# Patient Record
Sex: Male | Born: 1970
Health system: Southern US, Community
[De-identification: ages and names within clinical notes are randomized; demographics above are authoritative.]

## PROBLEM LIST (undated history)

## (undated) DIAGNOSIS — E669 Obesity, unspecified: Secondary | ICD-10-CM

## (undated) DIAGNOSIS — H409 Unspecified glaucoma: Secondary | ICD-10-CM

## (undated) DIAGNOSIS — T7840XA Allergy, unspecified, initial encounter: Secondary | ICD-10-CM

## (undated) DIAGNOSIS — E119 Type 2 diabetes mellitus without complications: Secondary | ICD-10-CM

## (undated) DIAGNOSIS — E785 Hyperlipidemia, unspecified: Secondary | ICD-10-CM

## (undated) DIAGNOSIS — F909 Attention-deficit hyperactivity disorder, unspecified type: Secondary | ICD-10-CM

## (undated) HISTORY — DX: Obesity, unspecified: E66.9

## (undated) HISTORY — PX: KNEE SURGERY: SHX244

## (undated) HISTORY — DX: Allergy, unspecified, initial encounter: T78.40XA

---

## 1998-04-15 ENCOUNTER — Emergency Department (HOSPITAL_COMMUNITY): Admission: EM | Admit: 1998-04-15 | Discharge: 1998-04-15 | Payer: Self-pay | Admitting: Emergency Medicine

## 1999-09-11 ENCOUNTER — Emergency Department (HOSPITAL_COMMUNITY): Admission: EM | Admit: 1999-09-11 | Discharge: 1999-09-11 | Payer: Self-pay | Admitting: Emergency Medicine

## 1999-09-11 ENCOUNTER — Encounter: Payer: Self-pay | Admitting: Emergency Medicine

## 1999-09-22 ENCOUNTER — Encounter: Payer: Self-pay | Admitting: Emergency Medicine

## 1999-09-22 ENCOUNTER — Emergency Department (HOSPITAL_COMMUNITY): Admission: EM | Admit: 1999-09-22 | Discharge: 1999-09-22 | Payer: Self-pay | Admitting: Emergency Medicine

## 2008-09-22 ENCOUNTER — Emergency Department (HOSPITAL_COMMUNITY): Admission: EM | Admit: 2008-09-22 | Discharge: 2008-09-23 | Payer: Self-pay | Admitting: Emergency Medicine

## 2011-08-19 LAB — URINALYSIS, ROUTINE W REFLEX MICROSCOPIC
Bilirubin Urine: NEGATIVE
Glucose, UA: NEGATIVE
Hgb urine dipstick: NEGATIVE
Ketones, ur: NEGATIVE
Nitrite: NEGATIVE
Protein, ur: NEGATIVE
Specific Gravity, Urine: 1.021
Urobilinogen, UA: 0.2
pH: 6

## 2011-08-19 LAB — POCT I-STAT, CHEM 8
BUN: 19
Chloride: 101
Creatinine, Ser: 1.3
Potassium: 3.7
Sodium: 135

## 2011-08-19 LAB — CBC
HCT: 40.5
Hemoglobin: 13.7
MCHC: 33.9
MCV: 90.4
Platelets: 198
RBC: 4.49
RDW: 12.5
WBC: 7.1

## 2011-08-19 LAB — DIFFERENTIAL
Lymphocytes Relative: 16
Lymphs Abs: 1.1
Neutrophils Relative %: 77

## 2011-08-19 LAB — URINE CULTURE
Colony Count: NO GROWTH
Culture: NO GROWTH

## 2011-11-02 ENCOUNTER — Ambulatory Visit (INDEPENDENT_AMBULATORY_CARE_PROVIDER_SITE_OTHER): Payer: BC Managed Care – PPO

## 2011-11-02 DIAGNOSIS — F988 Other specified behavioral and emotional disorders with onset usually occurring in childhood and adolescence: Secondary | ICD-10-CM

## 2011-11-02 DIAGNOSIS — R21 Rash and other nonspecific skin eruption: Secondary | ICD-10-CM

## 2011-12-25 ENCOUNTER — Telehealth: Payer: Self-pay

## 2011-12-25 NOTE — Telephone Encounter (Signed)
PT SAYS THAT THE 10 MG ADDERALL WAS NOT SIGNED AND WOULD LIKE FOR HIS GIRLFRIEND WOULD LIKE TO BRING THE SCRIPT BACK SO WE CAN WRITE ANOTHER ONE WITH A SIGNATURE WOULD LIKE A NURSE TO CALL HIM ABOUT THIS

## 2011-12-25 NOTE — Telephone Encounter (Signed)
SPOKE WITH PT AND APOLOGIZED FOR UNSIGNED RX. PT WILL HAVE HIS GIRLFRIEND COME TO GET IT SIGNED TODAY

## 2012-01-01 ENCOUNTER — Telehealth: Payer: Self-pay

## 2012-01-01 MED ORDER — AMPHETAMINE-DEXTROAMPHET ER 10 MG PO CP24: ORAL_CAPSULE | ORAL | Status: DC

## 2012-01-01 MED ORDER — AMPHETAMINE-DEXTROAMPHET ER 20 MG PO CP24: ORAL_CAPSULE | ORAL | Status: DC

## 2012-01-01 NOTE — Telephone Encounter (Signed)
.  [facility] PT IN NEED OF HIS ADDERALL. PLEASE CALL [phone] WHEN READY FOR PICK UP

## 2012-01-01 NOTE — Telephone Encounter (Signed)
Done. Ready to pick up.  Craig Jordan

## 2012-01-01 NOTE — Telephone Encounter (Signed)
LMOM THAT RX IS READY FOR PICKUP 

## 2012-01-01 NOTE — Telephone Encounter (Signed)
Pt last seen for this 11/02/11. Last RF 12/03/11.

## 2012-01-12 NOTE — Telephone Encounter (Signed)
Please contact pt he is upset that insurance comp want fill his rx, because of some timing issues on getting his rx refilled please contact.

## 2012-02-02 ENCOUNTER — Telehealth: Payer: Self-pay

## 2012-02-02 MED ORDER — AMPHETAMINE-DEXTROAMPHET ER 20 MG PO CP24: ORAL_CAPSULE | ORAL | Status: DC

## 2012-02-02 MED ORDER — AMPHETAMINE-DEXTROAMPHET ER 10 MG PO CP24: ORAL_CAPSULE | ORAL | Status: DC

## 2012-02-02 NOTE — Telephone Encounter (Signed)
Pt last seen for this 11/02/11, last RFs 01/01/12.

## 2012-02-02 NOTE — Telephone Encounter (Signed)
Signed at TL desk - ok to fill until 6/13

## 2012-02-02 NOTE — Telephone Encounter (Signed)
.  [facility]     PT REQUESTING ADDERALL REFILL   BEST PHONE [phone]

## 2012-02-03 NOTE — Telephone Encounter (Signed)
Pt notified that rx is ready for pick up

## 2012-02-24 ENCOUNTER — Telehealth: Payer: Self-pay

## 2012-02-24 MED ORDER — AMPHETAMINE-DEXTROAMPHET ER 10 MG PO CP24: ORAL_CAPSULE | ORAL | Status: DC

## 2012-02-24 NOTE — Telephone Encounter (Signed)
Spoke to pt, he has already been contacted.  Reviewed new dosage directions with him, he is aware of change and that rx will be at the front.

## 2012-02-24 NOTE — Telephone Encounter (Signed)
Pt called asking if he could possibly get his Adderall XR Rxs changed to all in 20 mg TABLET form. He currently gets XR 20 mg capsules and takes two Qam and then he gets XR 10 mg capsules and takes 1 -2 capsules Q 3pm. He would like to only have to p/up one Rx a month and only have one co-pay. Can we change it to tablets if it comes this way so he can get XR 20 mg tablets and take two Qam and 1/2 - 1 tab Q 3 pm? He is not due for another RF until 4/17 and he says that is fine, he has some left, he just wanted to get this changed if possible.

## 2012-02-24 NOTE — Telephone Encounter (Signed)
Adderall XR does not come in tablets but I can decreased the dose and have the pt take more pills in the am.  I have written the RX - I know it is not time but I wrote the RX to have it filled at the correct time.  So please go over this RX with the patient so he understands the change - He takes Adderall XR 40mg  q am and 10-20 in the afternoon.  I wrote them all in Adderall XR 10mg  to make the correct dose.

## 2012-03-06 ENCOUNTER — Other Ambulatory Visit: Payer: Self-pay

## 2012-03-30 ENCOUNTER — Telehealth: Payer: Self-pay

## 2012-03-30 NOTE — Telephone Encounter (Signed)
PT IN NEED OF HIS ADDERALL, STATES SINCE HE TAKES 4 IN THE MORNING AND 4 IN THE AFTERNOON WITH 10MG S. WOULD LIKE TO KNOW IF WE COULD UP HIS MGS SO HE WON'T HAVE TO TAKE SO  MANY PLEASE CALL [phone]

## 2012-04-02 NOTE — Telephone Encounter (Signed)
Please pull paper chart.  

## 2012-04-05 ENCOUNTER — Telehealth: Payer: Self-pay

## 2012-04-05 MED ORDER — AMPHETAMINE-DEXTROAMPHET ER 20 MG PO CP24: 40.0000 mg | ORAL_CAPSULE | Freq: Two times a day (BID) | ORAL | Status: DC

## 2012-04-05 NOTE — Telephone Encounter (Signed)
Notified pt that Rx is ready for p/up and that he is due for f/up bf next RF. Pt agreed.

## 2012-04-05 NOTE — Telephone Encounter (Signed)
PT STATES HE REALLY NEED HIS ADDERALL BY TODAY. PLEASE CALL [phone]

## 2012-04-05 NOTE — Telephone Encounter (Signed)
rx printed. Please advise patient that he needs OV for additional fills.

## 2012-04-05 NOTE — Telephone Encounter (Signed)
Patient's rx was printed, and he has been notified. (see other message)

## 2012-04-05 NOTE — Telephone Encounter (Signed)
Chart is pulled and put in phone messages

## 2012-04-06 ENCOUNTER — Other Ambulatory Visit: Payer: Self-pay | Admitting: Physician Assistant

## 2012-04-06 MED ORDER — AMPHETAMINE-DEXTROAMPHET ER 20 MG PO CP24: ORAL_CAPSULE | ORAL | Status: DC

## 2012-04-06 NOTE — Progress Notes (Signed)
Patient comes in to pick up his rx today and notices that it should be written as Adderall XR 20 mg 2 po QAM, then 1 po QPM #90. It was written on 5/20 for Adderall XR 20 mg 2 po QAM, then 2 po QPM #60. We have the original Rx and will shred and issue new rx with correct sig and quantity.

## 2012-05-17 ENCOUNTER — Ambulatory Visit (INDEPENDENT_AMBULATORY_CARE_PROVIDER_SITE_OTHER): Payer: BC Managed Care – PPO | Admitting: Family Medicine

## 2012-05-17 VITALS — BP 130/86 | HR 67 | Temp 99.2°F | Resp 16 | Ht 70.5 in | Wt 313.0 lb

## 2012-05-17 DIAGNOSIS — F988 Other specified behavioral and emotional disorders with onset usually occurring in childhood and adolescence: Secondary | ICD-10-CM

## 2012-05-17 MED ORDER — AMPHETAMINE-DEXTROAMPHETAMINE 20 MG PO TABS: ORAL_TABLET | ORAL | Status: DC

## 2012-05-17 NOTE — Progress Notes (Signed)
  S:  Initally diagnosed by Natividad Brood in childhood with ADD Doing well now. 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  He will try to taper pm dose now.  Plan:  Adderall (plain) 20 mg ii am and 1/2 to 1 in pm. Ok to refill x 6 months.

## 2012-06-12 ENCOUNTER — Telehealth: Payer: Self-pay

## 2012-06-12 DIAGNOSIS — F988 Other specified behavioral and emotional disorders with onset usually occurring in childhood and adolescence: Secondary | ICD-10-CM

## 2012-06-12 MED ORDER — AMPHETAMINE-DEXTROAMPHETAMINE 20 MG PO TABS: ORAL_TABLET | ORAL | Status: DC

## 2012-06-12 NOTE — Telephone Encounter (Signed)
LMOM THAT RX IS READY FOR PICKUP 

## 2012-06-12 NOTE — Telephone Encounter (Signed)
Pt is needing to get his adderall refilled please call [phone] when ready to pick up

## 2012-06-12 NOTE — Telephone Encounter (Signed)
Rx refilled and ready for pickup. May fill 06/17/12

## 2012-07-06 ENCOUNTER — Emergency Department (HOSPITAL_COMMUNITY)
Admission: EM | Admit: 2012-07-06 | Discharge: 2012-07-07 | Disposition: A | Discharge status: Home / Self Care | Payer: BC Managed Care – PPO | Attending: Emergency Medicine | Admitting: Emergency Medicine

## 2012-07-06 ENCOUNTER — Emergency Department (HOSPITAL_COMMUNITY): Payer: BC Managed Care – PPO

## 2012-07-06 ENCOUNTER — Encounter (HOSPITAL_COMMUNITY): Payer: Self-pay | Admitting: *Deleted

## 2012-07-06 DIAGNOSIS — F909 Attention-deficit hyperactivity disorder, unspecified type: Secondary | ICD-10-CM | POA: Insufficient documentation

## 2012-07-06 DIAGNOSIS — IMO0002 Reserved for concepts with insufficient information to code with codable children: Secondary | ICD-10-CM | POA: Insufficient documentation

## 2012-07-06 DIAGNOSIS — T148XXA Other injury of unspecified body region, initial encounter: Secondary | ICD-10-CM

## 2012-07-06 DIAGNOSIS — X500XXA Overexertion from strenuous movement or load, initial encounter: Secondary | ICD-10-CM | POA: Insufficient documentation

## 2012-07-06 HISTORY — DX: Attention-deficit hyperactivity disorder, unspecified type: F90.9

## 2012-07-06 MED ORDER — CYCLOBENZAPRINE HCL 10 MG PO TABS
10.0000 mg | ORAL_TABLET | Freq: Once | ORAL | Status: AC
  Administered 2012-07-07: 10 mg via ORAL
  Filled 2012-07-06: qty 1

## 2012-07-06 NOTE — ED Notes (Signed)
Pt states family member ran and jumped on his back and he felt a popping sensation in right rib area; concerned may have cracked a rib

## 2012-07-07 MED ORDER — CYCLOBENZAPRINE HCL 10 MG PO TABS: 10.0000 mg | ORAL_TABLET | Freq: Two times a day (BID) | ORAL | Status: AC | PRN

## 2012-07-07 NOTE — ED Provider Notes (Signed)
History     CSN: [id number]  Arrival date & time 07/06/12  2153   First MD Initiated Contact with Patient 07/06/12 2354      Chief Complaint  Patient presents with  . Flank Pain    (Consider location/radiation/quality/duration/timing/severity/associated sxs/prior treatment) HPI Comments: Craig Jordan 41 y.o. male   The chief complaint is: Patient presents with:   Flank Pain   The patient has medical history significant for:   Past Medical History:   ADHD (attention deficit hyperactivity disorder)             Patient presents for right rib pain. He states that his wife jumped on his back and he felt a "popping sensation". Patient is concerned for fracture. Patient states that pain is worse with turning and rates it a 7/10. Patient took one of his wife's flexeril with relief for a few hours. Denies constitutional symptoms. Denies CP, palpitations, or SOB.      The history is provided by the patient.    Past Medical History  Diagnosis Date  . ADHD (attention deficit hyperactivity disorder)     History reviewed. No pertinent past surgical history.  No family history on file.  History  Substance Use Topics  . Smoking status: Never Smoker   . Smokeless tobacco: Not on file  . Alcohol Use: Yes     social      Review of Systems  Constitutional: Negative for fever, chills and diaphoresis.  Respiratory: Negative for shortness of breath.   Cardiovascular: Negative for chest pain.  Musculoskeletal: Positive for back pain.  All other systems reviewed and are negative.    Allergies  Review of patient's allergies indicates no known allergies.  Home Medications   Current Outpatient Rx  Name Route Sig Dispense Refill  . AMPHETAMINE-DEXTROAMPHETAMINE 20 MG PO TABS Oral Take 20-40 mg by mouth 2 (two) times daily. 2 in am, 1 in pm    . TRAVOPROST (BAK FREE) 0.004 % OP SOLN Both Eyes Place 1 drop into both eyes at bedtime.      BP 111/62  Pulse 55  Temp 98.6  F (37 C) (Oral)  Resp 26  Ht 5\' 11"  (1.803 m)  Wt 315 lb (142.883 kg)  BMI 43.93 kg/m2  SpO2 100%  Physical Exam  Nursing note and vitals reviewed. Constitutional: He appears well-developed and well-nourished. No distress.  HENT:  Head: Normocephalic and atraumatic.  Mouth/Throat: Oropharynx is clear and moist.  Eyes: Conjunctivae and EOM are normal. No scleral icterus.  Neck: Normal range of motion. Neck supple.  Cardiovascular: Regular rhythm and normal heart sounds.   Pulmonary/Chest: Effort normal and breath sounds normal.  Abdominal: Soft. Bowel sounds are normal. There is no tenderness.  Musculoskeletal: Normal range of motion. He exhibits tenderness.       Tenderness to palpation of right thoracic area.  Neurological: He is alert.  Skin: Skin is warm and dry.    ED Course  Procedures (including critical care time)  Labs Reviewed - No data to display Dg Ribs Unilateral W/chest Right  07/06/2012  *RADIOLOGY REPORT*  Clinical Data: Flank pain.  The patient felt a pop in the mid posterior right rib area after injury.  RIGHT RIBS AND CHEST - 3+ VIEW  Comparison: None.  Findings: Slightly shallow inspiration with elevation of the left hemidiaphragm.  Normal heart size and pulmonary vascularity. Scattered fibrosis in the lungs.  No focal airspace consolidation. No edema.  No blunting of costophrenic angles.  No pneumothorax.  Mediastinal contours appear intact.  Right ribs appear intact.  No displaced fractures or focal bone lesions appreciated.  Mild degenerative changes in the spine.  IMPRESSION: No evidence of active pulmonary disease.  No displaced right rib fractures.   Original Report Authenticated By: Marlon Pel, M.D.      1. Muscle strain       MDM  Patient presented for right rib pain after his wife jumped on his back. Patient given flexeril in ED with relief. Patient discharged on the same. CXR: negative for rib fracture or other process. No red flags for  fracture or pneumothorax. Return precautions given verbally and in discharge summary.         Pixie Casino, PA-C 07/07/12 [phone]

## 2012-07-08 NOTE — ED Provider Notes (Signed)
Medical screening examination/treatment/procedure(s) were performed by non-physician practitioner and as supervising physician I was immediately available for consultation/collaboration.  Taniya Dasher R Treyvon Blahut, MD 07/08/12 0817 

## 2012-07-14 ENCOUNTER — Telehealth: Payer: Self-pay

## 2012-07-14 NOTE — Telephone Encounter (Signed)
Pt called to get his adderall rx called in to his pharmacy. I informed him that he may be advised to come in for an OV. He can be reached at [phone].

## 2012-07-15 MED ORDER — AMPHETAMINE-DEXTROAMPHETAMINE 20 MG PO TABS: 20.0000 mg | ORAL_TABLET | Freq: Two times a day (BID) | ORAL | Status: DC

## 2012-07-15 NOTE — Telephone Encounter (Signed)
Rx done and ready for pickup 

## 2012-07-15 NOTE — Telephone Encounter (Signed)
Should be ok for refills x 4 more months

## 2012-07-16 NOTE — Telephone Encounter (Signed)
LMOM RX ready to pick up. 

## 2012-08-21 ENCOUNTER — Telehealth: Payer: Self-pay

## 2012-08-21 MED ORDER — AMPHETAMINE-DEXTROAMPHETAMINE 20 MG PO TABS: 20.0000 mg | ORAL_TABLET | Freq: Two times a day (BID) | ORAL | Status: DC

## 2012-08-21 NOTE — Telephone Encounter (Signed)
LMOM RX ready to pick up. 

## 2012-08-21 NOTE — Telephone Encounter (Signed)
PT WOULD LIKE TO HAVE A REFILL ON HIS ADDERALL RX.  BEST# [phone]

## 2012-08-21 NOTE — Telephone Encounter (Signed)
Rx ready for pick up. 

## 2012-09-29 ENCOUNTER — Telehealth: Payer: Self-pay

## 2012-09-29 MED ORDER — AMPHETAMINE-DEXTROAMPHETAMINE 20 MG PO TABS: ORAL_TABLET | ORAL | Status: DC

## 2012-09-29 NOTE — Telephone Encounter (Signed)
Rx at front desk, called patient did not leave mssg b/c this is his business line.

## 2012-09-29 NOTE — Telephone Encounter (Signed)
Medication refilled, printed, signed, and at the TL desk.  

## 2012-09-29 NOTE — Telephone Encounter (Signed)
Patient advised.

## 2012-09-29 NOTE — Telephone Encounter (Signed)
Patient due for follow up in January/ Rx pended

## 2012-09-29 NOTE — Telephone Encounter (Signed)
Pt would like a refill on adderall. °Best# [phone] °

## 2012-10-30 ENCOUNTER — Telehealth: Payer: Self-pay

## 2012-10-30 MED ORDER — AMPHETAMINE-DEXTROAMPHETAMINE 20 MG PO TABS: ORAL_TABLET | ORAL | Status: DC

## 2012-10-30 NOTE — Telephone Encounter (Signed)
Rx printed.  Needs OV for next fill.

## 2012-10-30 NOTE — Telephone Encounter (Signed)
Pt would like a refill on adderall. Best# [phone]

## 2012-11-01 NOTE — Telephone Encounter (Signed)
Called patient to advise. He is reminded he is due for follow up in Jan.

## 2012-12-01 ENCOUNTER — Telehealth: Payer: Self-pay

## 2012-12-01 NOTE — Telephone Encounter (Signed)
Pt is needing to talk with someone about possibly scheduling a referral to a cardiologist   Best number [phone]

## 2012-12-02 ENCOUNTER — Emergency Department (HOSPITAL_COMMUNITY)
Admission: EM | Admit: 2012-12-02 | Discharge: 2012-12-03 | Disposition: A | Discharge status: Home / Self Care | Payer: BC Managed Care – PPO | Attending: Emergency Medicine | Admitting: Emergency Medicine

## 2012-12-02 DIAGNOSIS — Z79899 Other long term (current) drug therapy: Secondary | ICD-10-CM | POA: Insufficient documentation

## 2012-12-02 DIAGNOSIS — E669 Obesity, unspecified: Secondary | ICD-10-CM | POA: Insufficient documentation

## 2012-12-02 DIAGNOSIS — Z87891 Personal history of nicotine dependence: Secondary | ICD-10-CM | POA: Insufficient documentation

## 2012-12-02 DIAGNOSIS — H409 Unspecified glaucoma: Secondary | ICD-10-CM | POA: Insufficient documentation

## 2012-12-02 DIAGNOSIS — F909 Attention-deficit hyperactivity disorder, unspecified type: Secondary | ICD-10-CM | POA: Insufficient documentation

## 2012-12-02 DIAGNOSIS — R0789 Other chest pain: Secondary | ICD-10-CM | POA: Insufficient documentation

## 2012-12-02 DIAGNOSIS — R079 Chest pain, unspecified: Secondary | ICD-10-CM

## 2012-12-02 HISTORY — DX: Unspecified glaucoma: H40.9

## 2012-12-02 NOTE — Telephone Encounter (Signed)
Left message for patient to return call.

## 2012-12-02 NOTE — Telephone Encounter (Signed)
Patient due now for a follow up with his Adderall. Why does he need Cardiology referral?

## 2012-12-02 NOTE — Telephone Encounter (Signed)
Pt CB and reports that he has been experiencing some strange feelings in his chest mainly at night/evenings and not r/t exertion. He states it is not a pain but he feels like he should have his heart checked. I advised him that he should come in to be evaluated and possibly have an EKG and provider can refer to cardiologist if appropriate. Pt stated that is fine because he needs recheck for Adderall anyway. He will plan to come see her first thing Mon morning. I advised him if he has any pain/pressure in his chest/arm/jaw or SOB to go to ER. Pt agreed.

## 2012-12-03 ENCOUNTER — Encounter (HOSPITAL_COMMUNITY): Payer: Self-pay | Admitting: *Deleted

## 2012-12-03 ENCOUNTER — Emergency Department (HOSPITAL_COMMUNITY): Payer: BC Managed Care – PPO

## 2012-12-03 LAB — CBC WITH DIFFERENTIAL/PLATELET
Eosinophils Absolute: 0.2 10*3/uL (ref 0.0–0.7)
Eosinophils Relative: 3 % (ref 0–5)
HCT: 41.9 % (ref 39.0–52.0)
Hemoglobin: 14.6 g/dL (ref 13.0–17.0)
Lymphs Abs: 2.8 10*3/uL (ref 0.7–4.0)
MCH: 30.5 pg (ref 26.0–34.0)
MCV: 87.5 fL (ref 78.0–100.0)
Monocytes Relative: 6 % (ref 3–12)
Neutrophils Relative %: 47 % (ref 43–77)
RBC: 4.79 MIL/uL (ref 4.22–5.81)

## 2012-12-03 LAB — POCT I-STAT TROPONIN I: Troponin i, poc: 0 ng/mL (ref 0.00–0.08)

## 2012-12-03 LAB — BASIC METABOLIC PANEL
BUN: 21 mg/dL (ref 6–23)
GFR calc non Af Amer: >90 mL/min (ref >90)
Glucose, Bld: 86 mg/dL (ref 70–99)
Potassium: 3.8 mEq/L (ref 3.5–5.1)

## 2012-12-03 MED ORDER — DIAZEPAM 5 MG PO TABS
5.0000 mg | ORAL_TABLET | Freq: Once | ORAL | Status: AC
  Administered 2012-12-03: 5 mg via ORAL
  Filled 2012-12-03: qty 1

## 2012-12-03 MED ORDER — IBUPROFEN 800 MG PO TABS
800.0000 mg | ORAL_TABLET | Freq: Once | ORAL | Status: AC
  Administered 2012-12-03: 800 mg via ORAL
  Filled 2012-12-03: qty 1

## 2012-12-03 MED ORDER — OXYCODONE-ACETAMINOPHEN 5-325 MG PO TABS
1.0000 | ORAL_TABLET | Freq: Once | ORAL | Status: AC
  Administered 2012-12-03: 1 via ORAL
  Filled 2012-12-03: qty 1

## 2012-12-03 MED ORDER — IBUPROFEN 800 MG PO TABS: 800.0000 mg | ORAL_TABLET | Freq: Three times a day (TID) | ORAL | Status: DC

## 2012-12-03 NOTE — ED Provider Notes (Signed)
History     CSN: [id number]  Arrival date & time 12/02/12  2346   First MD Initiated Contact with Patient 12/02/12 2357      Chief Complaint  Patient presents with  . Chest Pain    (Consider location/radiation/quality/duration/timing/severity/associated sxs/prior treatment) HPI Comments: Craig Jordan reports having left-sided chest and axillary pain over the last 6 weeks.  Craig Jordan reports it is most noticeable at night when Craig Jordan lays down to sleep.  Craig Jordan describes an intermittent dull aching discomfort.  Sometimes Craig Jordan will have very transient sharp pain also.  The pain will often be relieved by raising his left arm above his head.  Craig Jordan denies fever, recent travel, leg pain/swelling, palpitations, cough, and SOB.  Craig Jordan also denies reflux/GERD/indigestion.    Patient is a 42 y.o. male presenting with chest pain. The history is provided by the patient. No language interpreter was used.  Chest Pain The chest pain began more  than 1 month ago (6 weeks). Chest pain occurs intermittently. The chest pain is worsening. At its most intense, the pain is at 7/10. The pain is currently at 6/10. The severity of the pain is moderate. The quality of the pain is described as sharp and stabbing (Craig Jordan describes a dull aching and a sharp pain.). The pain does not radiate. Chest pain is worsened by certain positions. Pertinent negatives for primary symptoms include no fever, no fatigue, no syncope, no shortness of breath, no cough, no wheezing, no palpitations, no abdominal pain, no nausea, no vomiting, no dizziness and no altered mental status.  Pertinent negatives for associated symptoms include no claudication, no diaphoresis, no lower extremity edema, no near-syncope, no orthopnea and no weakness. Craig Jordan tried nothing for the symptoms. Risk factors include male gender, obesity and stress (marijuana use).     Past Medical History  Diagnosis Date  . ADHD (attention deficit hyperactivity disorder)   . Glaucoma     Past  Surgical History  Procedure Date  . Knee surgery     Family History  Problem Relation Age of Onset  . Seizures Father     History  Substance Use Topics  . Smoking status: Former Smoker    Quit date: 05/17/1994  . Smokeless tobacco: Not on file  . Alcohol Use: 12.0 oz/week    20 Cans of beer per week      Review of Systems  Constitutional: Negative for fever, diaphoresis and fatigue.  Respiratory: Negative for cough, shortness of breath and wheezing.   Cardiovascular: Positive for chest pain. Negative for palpitations, orthopnea, claudication, syncope and near-syncope.  Gastrointestinal: Negative for nausea, vomiting and abdominal pain.  Neurological: Negative for dizziness and weakness.  Psychiatric/Behavioral: Negative for altered mental status.  All other systems reviewed and are negative.    Allergies  Review of patient's allergies indicates no known allergies.  Home Medications   Current Outpatient Rx  Name  Route  Sig  Dispense  Refill  . AMPHETAMINE-DEXTROAMPHETAMINE 20 MG PO TABS      Take 2 po in the morning and 1 po in the afternoon.   90 tablet   0     Patient due for follow up in January   . OXYMETAZOLINE HCL 0.05 % NA SOLN   Nasal   Place 2 sprays into the nose at bedtime as needed. For nasal congestion         . TRAVOPROST (BAK FREE) 0.004 % OP SOLN   Both Eyes   Place 1 drop into both eyes  at bedtime.           There were no vitals taken for this visit.  Physical Exam  Nursing note and vitals reviewed. Constitutional: Craig Jordan is oriented to person, place, and time. Craig Jordan appears well-nourished. No distress.       Pt is morbidly obese   HENT:  Head: Normocephalic and atraumatic.  Right Ear: External ear normal.  Left Ear: External ear normal.  Nose: Nose normal.  Mouth/Throat: Oropharynx is clear and moist. No oropharyngeal exudate.  Eyes: Conjunctivae normal are normal. Pupils are equal, round, and reactive to light. Right eye exhibits  no discharge. Left eye exhibits no discharge. No scleral icterus.  Neck: Normal range of motion. Neck supple. No JVD present. No tracheal deviation present.  Cardiovascular: Normal rate, regular rhythm, normal heart sounds and intact distal pulses.  Exam reveals no gallop and no friction rub.   No murmur heard. Pulmonary/Chest: Effort normal and breath sounds normal. No stridor. No respiratory distress. Craig Jordan has no wheezes. Craig Jordan has no rales. Craig Jordan exhibits no tenderness.  Abdominal: Soft. Bowel sounds are normal. Craig Jordan exhibits no distension and no mass. There is no tenderness. There is no rebound and no guarding.  Musculoskeletal: Normal range of motion. Craig Jordan exhibits no edema and no tenderness.  Lymphadenopathy:    Craig Jordan has no cervical adenopathy.  Neurological: Craig Jordan is alert and oriented to person, place, and time. No cranial nerve deficit.  Skin: Skin is warm and dry. No rash noted. Craig Jordan is not diaphoretic. No erythema. No pallor.  Psychiatric: Craig Jordan has a normal mood and affect. His behavior is normal.    ED Course  Procedures (including critical care time)  Labs Reviewed - No data to display No results found.   No diagnosis found.   Date: 12/03/2012 @ 0007  Rate: 72 bpm  Rhythm: sinus  QRS Axis: left  Intervals: normal  ST/T Wave abnormalities: normal  Conduction Disutrbances:nonspecific intraventricular conduction delay  Narrative Interpretation:   Old EKG Reviewed: none available      MDM  Pt presents for evaluation of chest discomfort.  Craig Jordan appears comfortable, note stable VS, NAD.  Although Craig Jordan describes positional pain, I cannot reproduce it on exam.  The EKG performed during the triage process demonstrates no evidence of acute ischemia.  Other than obesity, Craig Jordan denies risk factors for early CAD.  Will obtain basic labs, CXR, and trop.  Will provide symptomatic care, and reassess.  Clinically, Craig Jordan demonstrates no evidence of PE and is at low risk based on Well's Criteria.  1610.  Pt  stable, NAD.  Pain free.  Repeat trop is negative.  Plan discharge home.  Craig Jordan describes symptoms that do not appear to be consistent with chest pain from cardiac ischemia.  Will refer him for non emergent evaluation by a local cardiologist.     Tobin Chad, MD 12/03/12 [phone]

## 2012-12-03 NOTE — ED Notes (Addendum)
EKG old and new given to EDP, Powers, MD.

## 2012-12-03 NOTE — ED Notes (Signed)
Pt reports chest pain off and on x 6 weeks; states has increased in frequency over last week; episodes usually happen at night but lately have begun to happen during the day; pt states episodes increase when pt has not had sleep; episodes of chest pain typically last for a few minutes and then resolve; pt states pain is somewhat relieved by placing left arm over his head; pt denies nausea, SHOB, or vomiting with pain does c/o diaphoresis at times with pain and pain radiates to left shoulder and sometimes to his back.

## 2012-12-03 NOTE — ED Notes (Signed)
Attempted IV x 2 without success; pt becomes very anxious and tense when attempting to stick.

## 2012-12-13 ENCOUNTER — Ambulatory Visit (INDEPENDENT_AMBULATORY_CARE_PROVIDER_SITE_OTHER): Payer: BC Managed Care – PPO | Admitting: Family Medicine

## 2012-12-13 VITALS — BP 128/91 | HR 86 | Temp 98.8°F | Resp 17 | Ht 71.5 in | Wt 323.0 lb

## 2012-12-13 DIAGNOSIS — E669 Obesity, unspecified: Secondary | ICD-10-CM | POA: Insufficient documentation

## 2012-12-13 DIAGNOSIS — F988 Other specified behavioral and emotional disorders with onset usually occurring in childhood and adolescence: Secondary | ICD-10-CM | POA: Insufficient documentation

## 2012-12-13 DIAGNOSIS — Z23 Encounter for immunization: Secondary | ICD-10-CM

## 2012-12-13 MED ORDER — AMPHETAMINE-DEXTROAMPHETAMINE 20 MG PO TABS: ORAL_TABLET | ORAL | Status: DC

## 2012-12-13 NOTE — Progress Notes (Signed)
[facility] [address], [place] [address] [phone]  Date:  12/13/2012   Name:  Craig Jordan.   DOB:  1971-09-25   MRN:  [id number]  PCP:  Provider Not In System    Chief Complaint: Medication Refill   History of Present Illness:  Craig Jordan. is a 42 y.o. very pleasant male patient who presents with the following:  He is here today for a medication refill.  He was at the ED about 10 days ago with CP- he was treated with ibuprofen and had a negative evaluation- he was diagnosed with non- cardiac CP.  He will see cardiology in a few weeks.  He was able to work out yesterday on the eliptical machine without any CP.  The CP has not been bothering him any more.   He works in Aeronautical engineer.  He feels that his adderall is doing a good job controlling his symptoms without any negative SE that he has noted He takes adderall 20, 2 in the am and 1 in the afternoon- 60 mg total daily.   There is no problem list on file for this patient.   Past Medical History  Diagnosis Date  . ADHD (attention deficit hyperactivity disorder)   . Glaucoma     Past Surgical History  Procedure Date  . Knee surgery     History  Substance Use Topics  . Smoking status: Former Smoker    Quit date: 05/17/1994  . Smokeless tobacco: Not on file  . Alcohol Use: 12.0 oz/week    20 Cans of beer per week    Family History  Problem Relation Age of Onset  . Seizures Father   . Sleep apnea Brother     No Known Allergies  Medication list has been reviewed and updated.  Current Outpatient Prescriptions on File Prior to Visit  Medication Sig Dispense Refill  . amphetamine-dextroamphetamine (ADDERALL) 20 MG tablet Take 2 po in the morning and 1 po in the afternoon.  90 tablet  0  . ibuprofen (ADVIL,MOTRIN) 800 MG tablet Take 1 tablet (800 mg total) by mouth 3 (three) times daily.  21 tablet  0  . Travoprost, BAK Free, (TRAVATAN) 0.004 % SOLN ophthalmic solution Place  1 drop into both eyes at bedtime.      Marland Kitchen oxymetazoline (AFRIN) 0.05 % nasal spray Place 2 sprays into the nose at bedtime as needed. For nasal congestion        Review of Systems:  As per HPI- otherwise negative.   Physical Examination: Filed Vitals:   12/13/12 1042  BP: 128/91  Pulse: 86  Temp: 98.8 F (37.1 C)  Resp: 17   Filed Vitals:   12/13/12 1042  Height: 5' 11.5" (1.816 m)  Weight: 323 lb (146.512 kg)   Body mass index is 44.42 kg/(m^2). Ideal Body Weight: Weight in (lb) to have BMI = 25: 181.4   GEN: WDWN, NAD, Non-toxic, A & O x 3, obese HEENT: Atraumatic, Normocephalic. Neck supple. No masses, No LAD. Ears and Nose: No external deformity. CV: RRR, No M/G/R. No JVD. No thrill. No extra heart sounds. PULM: CTA B, no wheezes, crackles, rhonchi. No retractions. No resp. distress. No accessory muscle use. EXTR: No c/c/e NEURO Normal gait.  PSYCH: Normally interactive. Conversant. Not depressed or anxious appearing.  Calm demeanor.    Assessment and Plan: 1. ADD (attention deficit disorder)  amphetamine-dextroamphetamine (ADDERALL) 20 MG tablet, DISCONTINUED: amphetamine-dextroamphetamine (ADDERALL) 20 MG tablet,  DISCONTINUED: amphetamine-dextroamphetamine (ADDERALL) 20 MG tablet   Flu shot today.  Refilled his adderall for jan/ feb, feb/ march and march/ April. He can then call for 3 months more, and recheck in 6 months.   reminded him to seek care if any concerns regarding CP.    Abbe Amsterdam, MD

## 2012-12-13 NOTE — Patient Instructions (Addendum)
Please give [place] a call when you are close to finishing your 3rd refill of adderall.  Please come and see [place] in 6 months.

## 2012-12-20 ENCOUNTER — Encounter: Payer: Self-pay | Admitting: Cardiology

## 2012-12-20 ENCOUNTER — Ambulatory Visit (INDEPENDENT_AMBULATORY_CARE_PROVIDER_SITE_OTHER): Payer: BC Managed Care – PPO | Admitting: Cardiology

## 2012-12-20 VITALS — BP 124/80 | HR 66 | Ht 71.5 in | Wt 321.4 lb

## 2012-12-20 DIAGNOSIS — R0789 Other chest pain: Secondary | ICD-10-CM | POA: Insufficient documentation

## 2012-12-20 DIAGNOSIS — E669 Obesity, unspecified: Secondary | ICD-10-CM

## 2012-12-20 NOTE — Progress Notes (Signed)
Craig Jordan. Date of Birth: [birth year] Medical Record #[id number]  History of Present Illness: Mr. Cookston is seen at the request of Dr. Patsy Lager for evaluation of chest pain. He is a pleasant 43 year old white male with history of morbid obesity. He presents with complaint of chest pain. This is going on for several months. It is worse at the end of the week when he feels excessively tired. This is a light left precordial pain is worse with lying down. It does radiate to his axilla. It is improved if he holds his left arm over his head. He was seen in the emergency room recently for exacerbation of this pain. Cardiac enzymes were normal. Chest x-ray ECG were normal. He does have a family history of early coronary disease. In addition to the above symptoms patient complains that he has been lethargic over the past year he has a dry cough. He's concerned he may have sleep apnea. His cholesterol level is unknown.  Current Outpatient Prescriptions on File Prior to Visit  Medication Sig Dispense Refill  . amphetamine-dextroamphetamine (ADDERALL) 20 MG tablet Take 2 po in the morning and 1 po in the afternoon.  90 tablet  0  . ibuprofen (ADVIL,MOTRIN) 800 MG tablet Take 1 tablet (800 mg total) by mouth 3 (three) times daily.  21 tablet  0  . oxymetazoline (AFRIN) 0.05 % nasal spray Place 2 sprays into the nose at bedtime as needed. For nasal congestion      . Travoprost, BAK Free, (TRAVATAN) 0.004 % SOLN ophthalmic solution Place 1 drop into both eyes at bedtime.        No Known Allergies  Past Medical History  Diagnosis Date  . ADHD (attention deficit hyperactivity disorder)   . Glaucoma     Past Surgical History  Procedure Date  . Knee surgery     arthroscopic    History  Smoking status  . Former Smoker  . Quit date: 05/17/1994  Smokeless tobacco  . Not on file    History  Alcohol Use  . 12.0 oz/week  . 20 Cans of beer per week    Family History  Problem Relation Age  of Onset  . Seizures Father   . Sleep apnea Brother     Review of Systems: The review of systems is positive for morbid obesity. 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 All other systems were reviewed and are negative.  Physical Exam: BP 124/80  Pulse 66  Ht 5' 11.5" (1.816 m)  Wt 321 lb 6.4 oz (145.786 kg)  BMI 44.20 kg/m2 He is a obese white male in no acute distress.The patient is alert and oriented x 3.  The mood and affect are normal.  The skin is warm and dry.  Color is normal.  The HEENT exam reveals that the sclera are nonicteric.  The mucous membranes are moist.  The carotids are 2+ without bruits.  There is no thyromegaly.  There is no JVD.  The lungs are clear.  The chest wall is non tender.  The heart exam reveals a regular rate with a normal S1 and S2.  There are no murmurs, gallops, or rubs.  The PMI is not displaced.   Abdominal exam reveals good bowel sounds.  There is no guarding or rebound.  There is no hepatosplenomegaly or tenderness.  There are no masses.  Exam of the legs reveal no clubbing, cyanosis, or edema.  The legs are without rashes.  The  distal pulses are intact.  Cranial nerves II - XII are intact.  Motor and sensory functions are intact.  The gait is normal.  LABORATORY DATA: ECG was reviewed and is normal. CBC, basic metabolic panel, and cardiac enzymes.  Assessment / Plan: 1. Atypical chest pain. His symptoms are more consistent with musculoskeletal versus acid reflux pain. Given his family history we will do an exercise stress test to further stratify his cardiac risk. If normal may want to consider doing a sleep study to rule out sleep apnea. We will also check a fasting lipid panel at that time.  2. Morbid obesity.

## 2012-12-20 NOTE — Patient Instructions (Signed)
We will schedule you for an exercise stress test.  We will check your cholesterol levels.

## 2013-01-07 ENCOUNTER — Ambulatory Visit (INDEPENDENT_AMBULATORY_CARE_PROVIDER_SITE_OTHER): Payer: BC Managed Care – PPO | Admitting: Nurse Practitioner

## 2013-01-07 ENCOUNTER — Encounter: Payer: Self-pay | Admitting: Nurse Practitioner

## 2013-01-07 ENCOUNTER — Ambulatory Visit (INDEPENDENT_AMBULATORY_CARE_PROVIDER_SITE_OTHER): Payer: BC Managed Care – PPO | Admitting: *Deleted

## 2013-01-07 VITALS — BP 115/42 | HR 83

## 2013-01-07 DIAGNOSIS — R0789 Other chest pain: Secondary | ICD-10-CM

## 2013-01-07 DIAGNOSIS — R5383 Other fatigue: Secondary | ICD-10-CM

## 2013-01-07 DIAGNOSIS — E669 Obesity, unspecified: Secondary | ICD-10-CM

## 2013-01-07 DIAGNOSIS — R0683 Snoring: Secondary | ICD-10-CM

## 2013-01-07 DIAGNOSIS — R0989 Other specified symptoms and signs involving the circulatory and respiratory systems: Secondary | ICD-10-CM

## 2013-01-07 NOTE — Procedures (Signed)
Exercise Treadmill Test  Pre-Exercise Testing Evaluation Rhythm: normal sinus  Rate: 77     Test  Exercise Tolerance Test Ordering MD: Peter Swaziland, MD  Interpreting MD: Dyanne Iha NP  Unique Test No: 1  Treadmill:  1  Indication for ETT: chest pain - rule out ischemia  Contraindication to ETT: No   Stress Modality: exercise - treadmill  Cardiac Imaging Performed: non   Protocol: standard Bruce - maximal  Max BP:  155/65  Max MPHR (bpm):  162 85% MPR (bpm):  152  MPHR obtained (bpm):  157 % MPHR obtained:  87%  Reached 85% MPHR (min:sec):  7:52 Total Exercise Time (min-sec):  8:15  Workload in METS:  10.0 Borg Scale: 20  Reason ETT Terminated:  patient's desire to stop    ST Segment Analysis At Rest: normal ST segments - no evidence of significant ST depression With Exercise: no evidence of significant ST depression  Other Information Arrhythmia:  No Angina during ETT:  absent (0) Quality of ETT:  diagnostic  ETT Interpretation:  normal - no evidence of ischemia by ST analysis  Comments: Patient presents today for routine GXT. Has had atypical chest pain. Has a positive family history. No smoking history. Remains obese.  Today, he exercised on the standard Bruce protocol for a total of 8:15. He has fair exercise tolerance. Adequate blood pressure response. Clinically negative for chest pain. Test was stopped due to fatigue. Target heart rate was achieved. EKG negative for ischemia. No arrhythmia noted.   Recommendations: CV risk factor modification Check lipids today Arrange sleep study per Dr. Elvis Coil prior recommendation See back prn. Patient is agreeable to this plan and will call if any problems develop in the interim.

## 2013-01-07 NOTE — Patient Instructions (Addendum)
We will arrange for a split night sleep study  We will check your labs today  Work on your diet/exercise/weight loss  We will see you back as needed  Call the [facility] office at [phone] if you have any questions, problems or concerns.

## 2013-01-11 ENCOUNTER — Telehealth: Payer: Self-pay | Admitting: Radiology

## 2013-01-11 ENCOUNTER — Telehealth: Payer: Self-pay | Admitting: Family Medicine

## 2013-01-11 DIAGNOSIS — E785 Hyperlipidemia, unspecified: Secondary | ICD-10-CM

## 2013-01-11 MED ORDER — PRAVASTATIN SODIUM 40 MG PO TABS: 40.0000 mg | ORAL_TABLET | Freq: Every day | ORAL | Status: DC

## 2013-01-11 NOTE — Telephone Encounter (Signed)
Message copied by Pearline Cables on Tue Jan 11, 2013 12:58 PM ------      Message from: [place], PETER M      Created: Mon Jan 10, 2013  4:45 PM       Cholesterol is moderately elevated. I think he would benefit from statin therapy. ETT was negative. I think he can follow up with Dr. Patsy Lager for management of his cholesterol.            Peter Swaziland MD, [facility]             ------

## 2013-01-11 NOTE — Telephone Encounter (Signed)
Can we add on this through your lab? Please advise. Amy

## 2013-01-11 NOTE — Telephone Encounter (Signed)
Called cell phone and LMOM- it seems to be a business phone number so did not leave details.

## 2013-01-11 NOTE — Telephone Encounter (Signed)
Spoke with him- he would like to go ahead and start a statin.  Will rx pravachol, also discussed diet and exercise changes.  Will see if LFTs can be added to blood in lab- if not will have him come in for LFTs in the next week or so, then plan to recheck a FLP in a few months

## 2013-01-11 NOTE — Telephone Encounter (Signed)
Message copied by Caffie Damme on Tue Jan 11, 2013  2:57 PM ------      Message from: Abbe Amsterdam C      Created: Tue Jan 11, 2013  1:38 PM       We are starting cholesterol meds for him- I saw that he has some labs drawn at [facility] a few days ago.  Can we have liver function added to these?  If not I will have him come in for a draw here            Thanks!      JC ------

## 2013-01-12 ENCOUNTER — Telehealth: Payer: Self-pay | Admitting: Radiology

## 2013-01-12 NOTE — Telephone Encounter (Signed)
We can not add on this but message sent to [facility] to see if they can add this on.

## 2013-01-12 NOTE — Telephone Encounter (Signed)
Message copied by Caffie Damme on Wed Jan 12, 2013  2:16 PM ------      Message from: Abbe Amsterdam C      Created: Tue Jan 11, 2013  1:38 PM       We are starting cholesterol meds for him- I saw that he has some labs drawn at [facility] a few days ago.  Can we have liver function added to these?  If not I will have him come in for a draw here            Thanks!      JC ------

## 2013-01-26 ENCOUNTER — Ambulatory Visit (HOSPITAL_BASED_OUTPATIENT_CLINIC_OR_DEPARTMENT_OTHER): Payer: BC Managed Care – PPO | Attending: Nurse Practitioner | Admitting: Radiology

## 2013-01-26 VITALS — Ht 71.0 in | Wt 310.0 lb

## 2013-01-26 DIAGNOSIS — R0683 Snoring: Secondary | ICD-10-CM

## 2013-01-26 DIAGNOSIS — G4733 Obstructive sleep apnea (adult) (pediatric): Secondary | ICD-10-CM

## 2013-01-26 DIAGNOSIS — G4730 Sleep apnea, unspecified: Secondary | ICD-10-CM | POA: Insufficient documentation

## 2013-01-26 DIAGNOSIS — R0789 Other chest pain: Secondary | ICD-10-CM

## 2013-01-26 DIAGNOSIS — R5383 Other fatigue: Secondary | ICD-10-CM

## 2013-01-26 DIAGNOSIS — G4710 Hypersomnia, unspecified: Secondary | ICD-10-CM | POA: Insufficient documentation

## 2013-02-09 DIAGNOSIS — G4730 Sleep apnea, unspecified: Secondary | ICD-10-CM

## 2013-02-09 DIAGNOSIS — G4710 Hypersomnia, unspecified: Secondary | ICD-10-CM

## 2013-02-09 NOTE — Procedures (Signed)
NAME:  Craig Jordan, Craig Jordan NO.:  [id number]  MEDICAL RECORD NO.:  [id number]          PATIENT TYPE:  OUT  LOCATION:  SLEEP CENTER                 FACILITY:  [facility]  PHYSICIAN:  Barbaraann Share, MD,FCCPDATE OF BIRTH:  1971-10-06  DATE OF STUDY:  01/26/2013                           NOCTURNAL POLYSOMNOGRAM  REFERRING PHYSICIAN:  Jennet Maduro GERHARDT  LOCATION:  Sleep lab.  INDICATION FOR STUDY:  Hypersomnia with sleep apnea  EPWORTH SLEEPINESS SCORE:  4.  MEDICATIONS:  SLEEP ARCHITECTURE:  The patient was found have a total sleep time of 353 minutes with adequate slow-wave sleep for age, but decreased quantity of REM.  Sleep onset latency was normal at 4 minutes and REM onset was prolonged at 204 minutes.  Sleep efficiency was mildly decreased at 85%.  RESPIRATORY DATA:  The patient was found to have no obstructive apneas and 21 obstructive hypopneas, giving him an apnea-hypopnea index of only 4 events per hour.  The events occurred in all body positions and there was loud snoring noted throughout.  The patient did not meet split night protocol secondary to his small numbers of events.  OXYGEN DATA:  There was transient O2 desaturation as low as 88% associated with his obstructive events.  CARDIAC DATA:  Occasional PAC noted, but no clinically significant arrhythmias were seen.  MOVEMENT-PARASOMNIA:  The patient had no significant leg jerks or other behavioral abnormalities.  IMPRESSIONS-RECOMMENDATIONS: 1. Small numbers of obstructive events which do not meet the AHI     criteria for the obstructive sleep apnea     syndrome.  The patient did have loud snoring, and should be     encouraged to work aggressively on weight loss. 2. Occasional PAC noted, but no clinically significant arrhythmias     were seen.     Barbaraann Share, MD,FCCP Diplomate, American Board of Sleep Medicine    [facility]/MEDQ  D:  02/09/2013 08:35:49  T:  02/09/2013 09:10:03  Job:   [id number]

## 2013-02-11 ENCOUNTER — Encounter: Payer: Self-pay | Admitting: Family Medicine

## 2013-02-11 DIAGNOSIS — R0683 Snoring: Secondary | ICD-10-CM | POA: Insufficient documentation

## 2013-03-07 ENCOUNTER — Telehealth: Payer: Self-pay

## 2013-03-07 DIAGNOSIS — F988 Other specified behavioral and emotional disorders with onset usually occurring in childhood and adolescence: Secondary | ICD-10-CM

## 2013-03-07 MED ORDER — AMPHETAMINE-DEXTROAMPHETAMINE 20 MG PO TABS: ORAL_TABLET | ORAL | Status: DC

## 2013-03-07 NOTE — Telephone Encounter (Signed)
Pended, please advise

## 2013-03-07 NOTE — Telephone Encounter (Signed)
PT IN NEED OF HIS ADDERALL. PLEASE CALL [phone] WHEN READY FOR P/U

## 2013-03-14 ENCOUNTER — Telehealth: Payer: Self-pay

## 2013-03-14 NOTE — Telephone Encounter (Signed)
PT WAS REFERRED FOR A SLEEP STUDY BY [place].  SAYS THIS WAS DONE APPROXIMATELY TWO MONTHS AGO AND HE HAS NOT HEARD ANYTHING FROM [place] ABOUT THIS.  PLEASE CALL ASAP [phone]

## 2013-03-15 NOTE — Telephone Encounter (Signed)
Reviewed sleep study: IMPRESSIONS-RECOMMENDATIONS:  1. Small numbers of obstructive events which do not meet the AHI  criteria for the obstructive sleep apnea  syndrome. The patient did have loud snoring, and should be  encouraged to work aggressively on weight loss.  2. Occasional PAC noted, but no clinically significant arrhythmias  were seen  Discussed with pt- I am sorry, I thought he had already heard these results.  Weight loss is the next step.  He does not have OSA.  He wishes to start a formal exercise program through his gym, and I encouraged him to join weight watchers.  Also discussed the bariatric program through [facility], but he does not feel he is at this point yet.    He will work on weight loss and come see me in about 2 months, at which time we can also recheck his cholesterol

## 2013-03-15 NOTE — Telephone Encounter (Signed)
Dr Patsy Lager can you review sleep study?

## 2013-06-08 ENCOUNTER — Ambulatory Visit (INDEPENDENT_AMBULATORY_CARE_PROVIDER_SITE_OTHER): Payer: BC Managed Care – PPO | Admitting: Family Medicine

## 2013-06-08 VITALS — BP 110/72 | HR 85 | Temp 97.9°F | Resp 18 | Ht 71.5 in | Wt 319.0 lb

## 2013-06-08 DIAGNOSIS — E669 Obesity, unspecified: Secondary | ICD-10-CM

## 2013-06-08 DIAGNOSIS — F988 Other specified behavioral and emotional disorders with onset usually occurring in childhood and adolescence: Secondary | ICD-10-CM

## 2013-06-08 MED ORDER — AMPHETAMINE-DEXTROAMPHET ER 20 MG PO CP24: ORAL_CAPSULE | ORAL | Status: DC

## 2013-06-08 MED ORDER — AMPHETAMINE-DEXTROAMPHETAMINE 20 MG PO TABS: ORAL_TABLET | ORAL | Status: DC

## 2013-06-08 NOTE — Patient Instructions (Addendum)
Congratulations on your upcoming wedding!  Good luck with diet and exercise- consider joining weight watchers.  Daily exercise is also important.  Be sure you are not getting liquid calories (soda, sweet tea, regular alcohol).

## 2013-06-08 NOTE — Progress Notes (Signed)
[facility] [address] [place] [address] [phone]  Date:  06/08/2013   Name:  Craig Jordan.   DOB:  1971/10/06   MRN:  [id number]  PCP:  Abbe Amsterdam, MD    Chief Complaint: Follow-up   History of Present Illness:  Craig Jordan. is a 42 y.o. very pleasant male patient who presents with the following:  He is here today for a refill of his adderall.  His appetite is ok, and he feels that he is doing well on his current medication dose.  He has seen cardiology and had a sleep study and all is ok  He is here today with his fiance they are getting married in September.   He does note that he has gained some weight, and wants to know how he can lose weight.  He is now on cholesterol medication per Dr. Swaziland  Patient Active Problem List   Diagnosis Date Noted  . Snoring 02/11/2013  . Chest pain, atypical 12/20/2012  . ADD (attention deficit disorder) 12/13/2012  . Obesity 12/13/2012    Past Medical History  Diagnosis Date  . ADHD (attention deficit hyperactivity disorder)   . Glaucoma   . Obesity     Past Surgical History  Procedure Laterality Date  . Knee surgery      arthroscopic    History  Substance Use Topics  . Smoking status: Former Smoker    Quit date: 05/17/1994  . Smokeless tobacco: Not on file  . Alcohol Use: 12.0 oz/week    20 Cans of beer per week    Family History  Problem Relation Age of Onset  . Seizures Father   . Sleep apnea Brother     No Known Allergies  Medication list has been reviewed and updated.  Current Outpatient Prescriptions on File Prior to Visit  Medication Sig Dispense Refill  . amphetamine-dextroamphetamine (ADDERALL) 20 MG tablet Take 2 po in the morning and 1 po in the afternoon.  90 tablet  0  . ibuprofen (ADVIL,MOTRIN) 800 MG tablet Take 1 tablet (800 mg total) by mouth 3 (three) times daily.  21 tablet  0  . oxymetazoline (AFRIN) 0.05 % nasal spray Place 2 sprays into the  nose at bedtime as needed. For nasal congestion      . pravastatin (PRAVACHOL) 40 MG tablet Take 1 tablet (40 mg total) by mouth daily.  90 tablet  3  . Travoprost, BAK Free, (TRAVATAN) 0.004 % SOLN ophthalmic solution Place 1 drop into both eyes at bedtime.       No current facility-administered medications on file prior to visit.    Review of Systems:  As per HPI- otherwise negative.   Physical Examination: Filed Vitals:   06/08/13 1254  BP: 110/72  Pulse: 85  Temp: 97.9 F (36.6 C)  Resp: 18   Filed Vitals:   06/08/13 1254  Height: 5' 11.5" (1.816 m)  Weight: 319 lb (144.697 kg)   Body mass index is 43.88 kg/(m^2). Ideal Body Weight: Weight in (lb) to have BMI = 25: 181.4  GEN: WDWN, NAD, Non-toxic, A & O x 3, obese, accompanied by his fiance today HEENT: Atraumatic, Normocephalic. Neck supple. No masses, No LAD. Ears and Nose: No external deformity. CV: RRR, No M/G/R. No JVD. No thrill. No extra heart sounds. PULM: CTA B, no wheezes, crackles, rhonchi. No retractions. No resp. distress. No accessory muscle use. EXTR: No c/c/e NEURO Normal gait.  PSYCH: Normally  interactive. Conversant. Not depressed or anxious appearing.  Calm demeanor.    Assessment and Plan: ADD (attention deficit disorder) - Plan: amphetamine-dextroamphetamine (ADDERALL) 20 MG tablet, amphetamine-dextroamphetamine (ADDERALL) 20 MG tablet, DISCONTINUED: amphetamine-dextroamphetamine (ADDERALL) 20 MG tablet, DISCONTINUED: amphetamine-dextroamphetamine (ADDERALL XR) 20 MG 24 hr capsule  Obesity, unspecified  ADHD: refilled his adderall today I accidentally gave him an rx for adderall XR- disposed of this rx and wrote a 3rd adderall regular rx for him.   Discussed his weight- encouraged a sensible diet program such as weight watchers, and discussed regular exercise.  He does admit to sweet tea and alcohol- encouraged him to eliminate these liquid calories.  Signed Abbe Amsterdam, MD

## 2013-08-03 ENCOUNTER — Telehealth: Payer: Self-pay

## 2013-08-03 DIAGNOSIS — F988 Other specified behavioral and emotional disorders with onset usually occurring in childhood and adolescence: Secondary | ICD-10-CM

## 2013-08-03 MED ORDER — AMPHETAMINE-DEXTROAMPHETAMINE 20 MG PO TABS: ORAL_TABLET | ORAL | Status: DC

## 2013-08-03 NOTE — Telephone Encounter (Signed)
Refill on Adderrall. [phone]

## 2013-08-03 NOTE — Telephone Encounter (Signed)
Called and LMOM. It looks like I incorrectly dated the last rx written on 7/23- it said to fill on 9/21, but should hae been 8/21.  I think he and the pharmacist must have worked this out.  Let me know if any problem.  Otherwise I will go ahead and refill this now

## 2013-09-02 ENCOUNTER — Telehealth: Payer: Self-pay

## 2013-09-02 DIAGNOSIS — F988 Other specified behavioral and emotional disorders with onset usually occurring in childhood and adolescence: Secondary | ICD-10-CM

## 2013-09-02 MED ORDER — AMPHETAMINE-DEXTROAMPHETAMINE 20 MG PO TABS: ORAL_TABLET | ORAL | Status: DC

## 2013-09-02 MED ORDER — AMPHETAMINE-DEXTROAMPHET ER 20 MG PO CP24: ORAL_CAPSULE | ORAL | Status: DC

## 2013-09-02 NOTE — Telephone Encounter (Signed)
Refill   amphetamine-dextroamphetamine (ADDERALL) 20 MG tablet   [phone]

## 2013-09-02 NOTE — Telephone Encounter (Signed)
Pended please advise.  

## 2013-11-03 ENCOUNTER — Telehealth: Payer: Self-pay

## 2013-11-03 DIAGNOSIS — F988 Other specified behavioral and emotional disorders with onset usually occurring in childhood and adolescence: Secondary | ICD-10-CM

## 2013-11-03 MED ORDER — AMPHETAMINE-DEXTROAMPHETAMINE 20 MG PO TABS: ORAL_TABLET | ORAL | Status: DC

## 2013-11-03 NOTE — Telephone Encounter (Signed)
Will you review this for me? He appears to have Adderall XR for this month, but not regular, is this correct? Pended Adderall

## 2013-11-03 NOTE — Telephone Encounter (Signed)
He should have the Rx already for this month. Called him. Left message for him to call me back. He is on regular Adderall not XR, the XR was error, have deleted this from his list.

## 2013-11-03 NOTE — Telephone Encounter (Signed)
adderall refill   [phone]

## 2013-11-03 NOTE — Telephone Encounter (Signed)
Did a refill for December into January and then January to February.  Asked him to come and see me in [facility] February for a recheck.  He agreed

## 2013-12-26 ENCOUNTER — Other Ambulatory Visit: Payer: Self-pay | Admitting: Family Medicine

## 2014-01-02 ENCOUNTER — Telehealth: Payer: Self-pay

## 2014-01-02 ENCOUNTER — Ambulatory Visit (INDEPENDENT_AMBULATORY_CARE_PROVIDER_SITE_OTHER): Payer: BC Managed Care – PPO | Admitting: Family Medicine

## 2014-01-02 VITALS — BP 134/92 | HR 64 | Temp 98.3°F | Resp 16 | Ht 70.5 in | Wt 326.0 lb

## 2014-01-02 DIAGNOSIS — F988 Other specified behavioral and emotional disorders with onset usually occurring in childhood and adolescence: Secondary | ICD-10-CM

## 2014-01-02 MED ORDER — AMPHETAMINE-DEXTROAMPHETAMINE 20 MG PO TABS: ORAL_TABLET | ORAL | Status: DC

## 2014-01-02 NOTE — Telephone Encounter (Signed)
Pt will come in today for a f/u

## 2014-01-02 NOTE — Patient Instructions (Signed)
If your leg numbness is changing or getting worse please let me know.  Work on losing weight but let me know if you have any other concerns.

## 2014-01-02 NOTE — Progress Notes (Signed)
[facility] [address] [place] [address] [phone]  Date:  01/02/2014   Name:  Craig Jordan.   DOB:  [birth year]   MRN:  [id number]  PCP:  Lamar Blinks, MD    Chief Complaint: Medication Refill   History of Present Illness:  Craig Jordan. is a 43 y.o. very pleasant male patient who presents with the following:  He needs a refill of his adderall today.  He is doing well with his current dose of medication and has no issues with this.  He has noted some numbness on his anterior thighs for about one year- or moe.  This seems to come and go, and can sometimes hurt. He does notice it has become worse as he gained weight over the winter.   He does not have any other numbness or weakness, no numbness in his feet  Wt Readings from Last 3 Encounters:  01/02/14 326 lb (147.873 kg)  06/08/13 319 lb (144.697 kg)  01/26/13 310 lb (140.615 kg)   He did recently get married  Patient Active Problem List   Diagnosis Date Noted  . Snoring 02/11/2013  . Chest pain, atypical 12/20/2012  . ADD (attention deficit disorder) 12/13/2012  . Obesity 12/13/2012    Past Medical History  Diagnosis Date  . ADHD (attention deficit hyperactivity disorder)   . Glaucoma   . Obesity     Past Surgical History  Procedure Laterality Date  . Knee surgery      arthroscopic    History  Substance Use Topics  . Smoking status: Former Smoker    Quit date: 05/17/1994  . Smokeless tobacco: Not on file  . Alcohol Use: 12.0 oz/week    20 Cans of beer per week    Family History  Problem Relation Age of Onset  . Seizures Father   . Sleep apnea Brother     No Known Allergies  Medication list has been reviewed and updated.  Current Outpatient Prescriptions on File Prior to Visit  Medication Sig Dispense Refill  . amphetamine-dextroamphetamine (ADDERALL) 20 MG tablet Take 2 po in the morning and 1 po in the afternoon. To fill 12/01/2013  90 tablet  0  .  oxymetazoline (AFRIN) 0.05 % nasal spray Place 2 sprays into the nose at bedtime as needed. For nasal congestion      . pravastatin (PRAVACHOL) 40 MG tablet Take 1 tablet (40 mg total) by mouth daily. PATIENT NEEDS OFFICE VISIT FOR ADDITIONAL REFILLS  30 tablet  0  . Travoprost, BAK Free, (TRAVATAN) 0.004 % SOLN ophthalmic solution Place 1 drop into both eyes at bedtime.      Marland Kitchen amphetamine-dextroamphetamine (ADDERALL) 20 MG tablet Take 2 po in the morning and 1 po in the afternoon.  90 tablet  0  . amphetamine-dextroamphetamine (ADDERALL) 20 MG tablet Take 2 po in the morning and 1 po in the afternoon.  90 tablet  0  . ibuprofen (ADVIL,MOTRIN) 800 MG tablet Take 1 tablet (800 mg total) by mouth 3 (three) times daily.  21 tablet  0   No current facility-administered medications on file prior to visit.    Review of Systems:  As per HPI- otherwise negative.   Physical Examination: Filed Vitals:   01/02/14 1338  BP: 100/90  Pulse: 64  Temp: 98.3 F (36.8 C)  Resp: 16   Filed Vitals:   01/02/14 1338  Height: 5' 10.5" (1.791 m)  Weight: 326 lb (147.873 kg)  Body mass index is 46.1 kg/(m^2). Ideal Body Weight: Weight in (lb) to have BMI = 25: 176.4  GEN: WDWN, NAD, Non-toxic, A & O x 3, obese, looks well HEENT: Atraumatic, Normocephalic. Neck supple. No masses, No LAD. PEERL Ears and Nose: No external deformity. CV: RRR, No M/G/R. No JVD. No thrill. No extra heart sounds. PULM: CTA B, no wheezes, crackles, rhonchi. No retractions. No resp. distress. No accessory muscle use. ABD: S, NT, ND, +BS. No rebound. No HSM. EXTR: No c/c/e NEURO Normal gait.  PSYCH: Normally interactive. Conversant. Not depressed or anxious appearing.  Calm demeanor.  Full strength and normal DTR of both LE.  subjective difference in sensation over an approx 6x 4in area on both anterior thighs.  No rash or skin change per his report   Assessment and Plan: ADD (attention deficit disorder) - Plan:  amphetamine-dextroamphetamine (ADDERALL) 20 MG tablet, amphetamine-dextroamphetamine (ADDERALL) 20 MG tablet, amphetamine-dextroamphetamine (ADDERALL) 20 MG tablet  Refilled his adderall for 3 months.  Unclear exactly why he has this change in sensation on his thighs. Suspect weight gain may be causing his clothes to constrict the inguinal area.  He will work on weight loss and follow-up if not better- Sooner if worse.     Signed Lamar Blinks, MD

## 2014-01-02 NOTE — Telephone Encounter (Signed)
Patient due for follow up. What is the plan?

## 2014-01-02 NOTE — Telephone Encounter (Signed)
PT IN NEED OF HIS ADDERALL. PLEASE CALL [phone]

## 2014-01-26 ENCOUNTER — Other Ambulatory Visit: Payer: Self-pay | Admitting: Family Medicine

## 2014-02-21 IMAGING — CR DG RIBS W/ CHEST 3+V*R*
3 series · 3 of 3 positions shown · non-contrast
Comparison: None.

CLINICAL DATA: Flank pain.  The patient felt a pop in the mid
posterior right rib area after injury.

RIGHT RIBS AND CHEST - 3+ VIEW

[w chest pa]
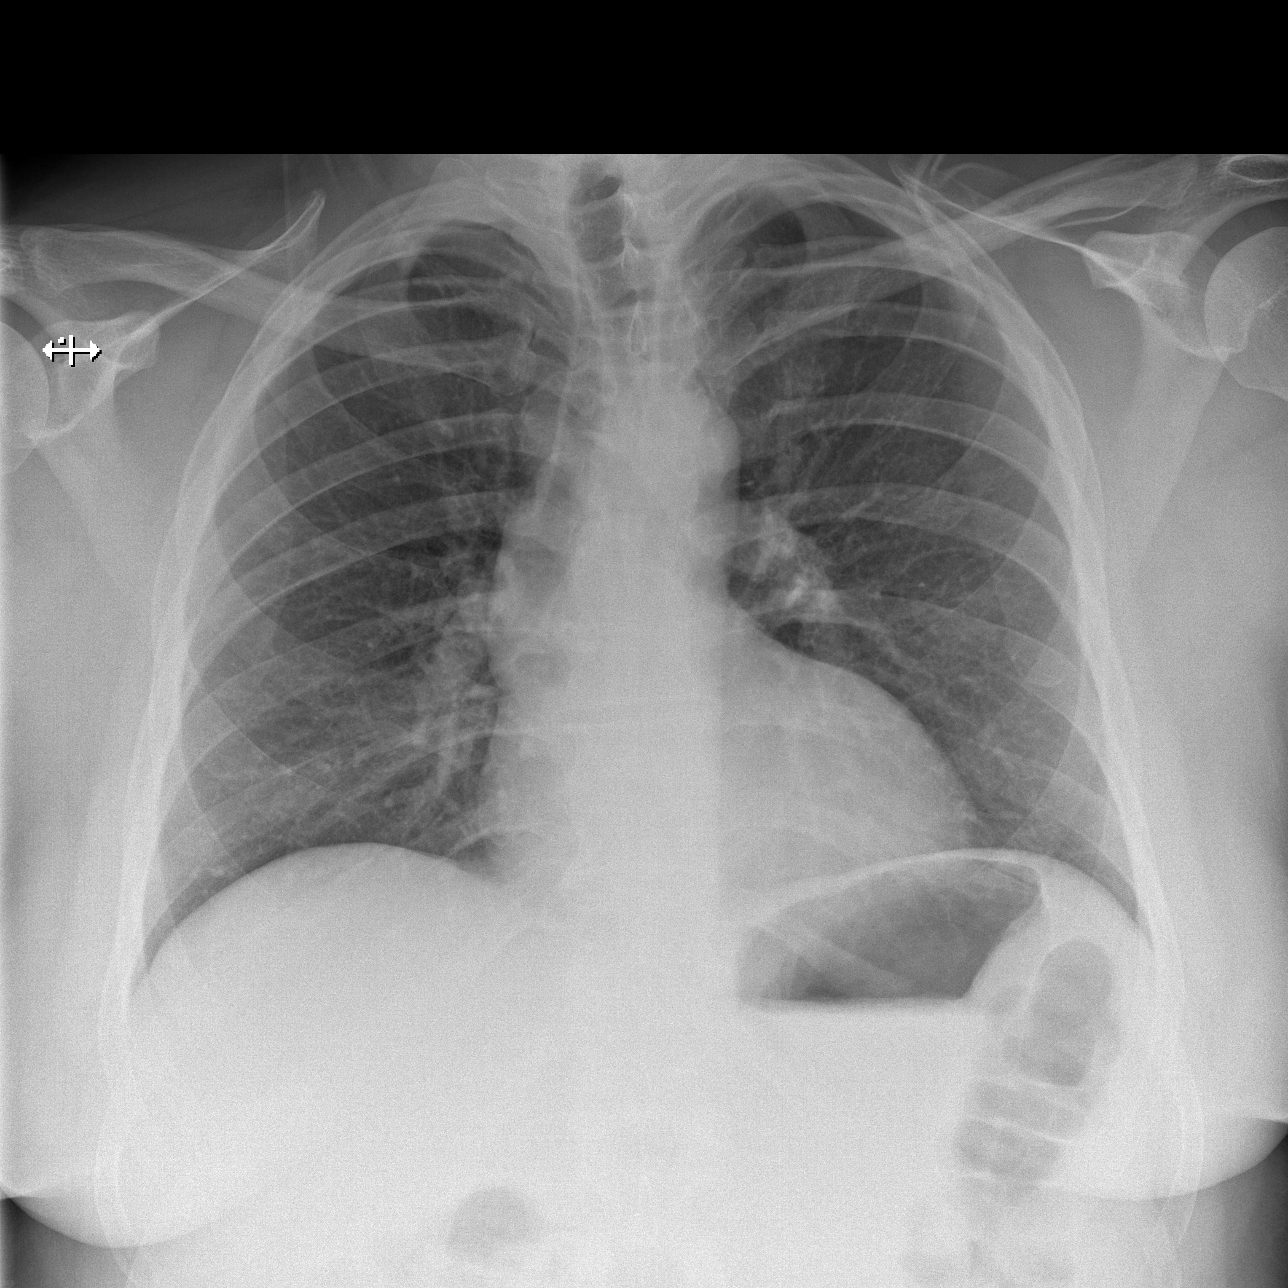

[w ribs ap upper right]
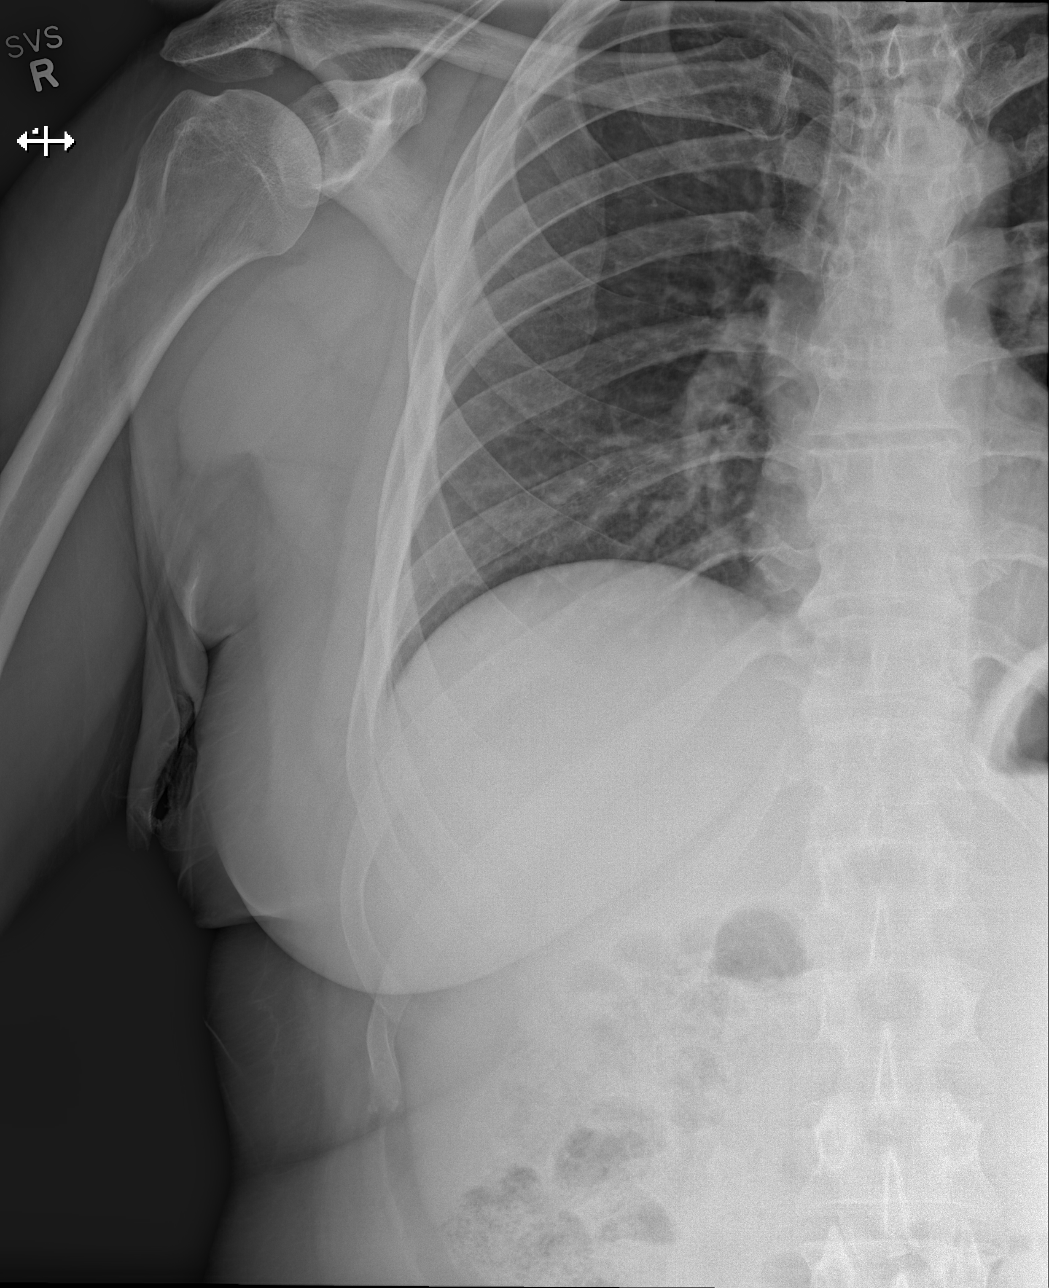

[w ribs obl right]
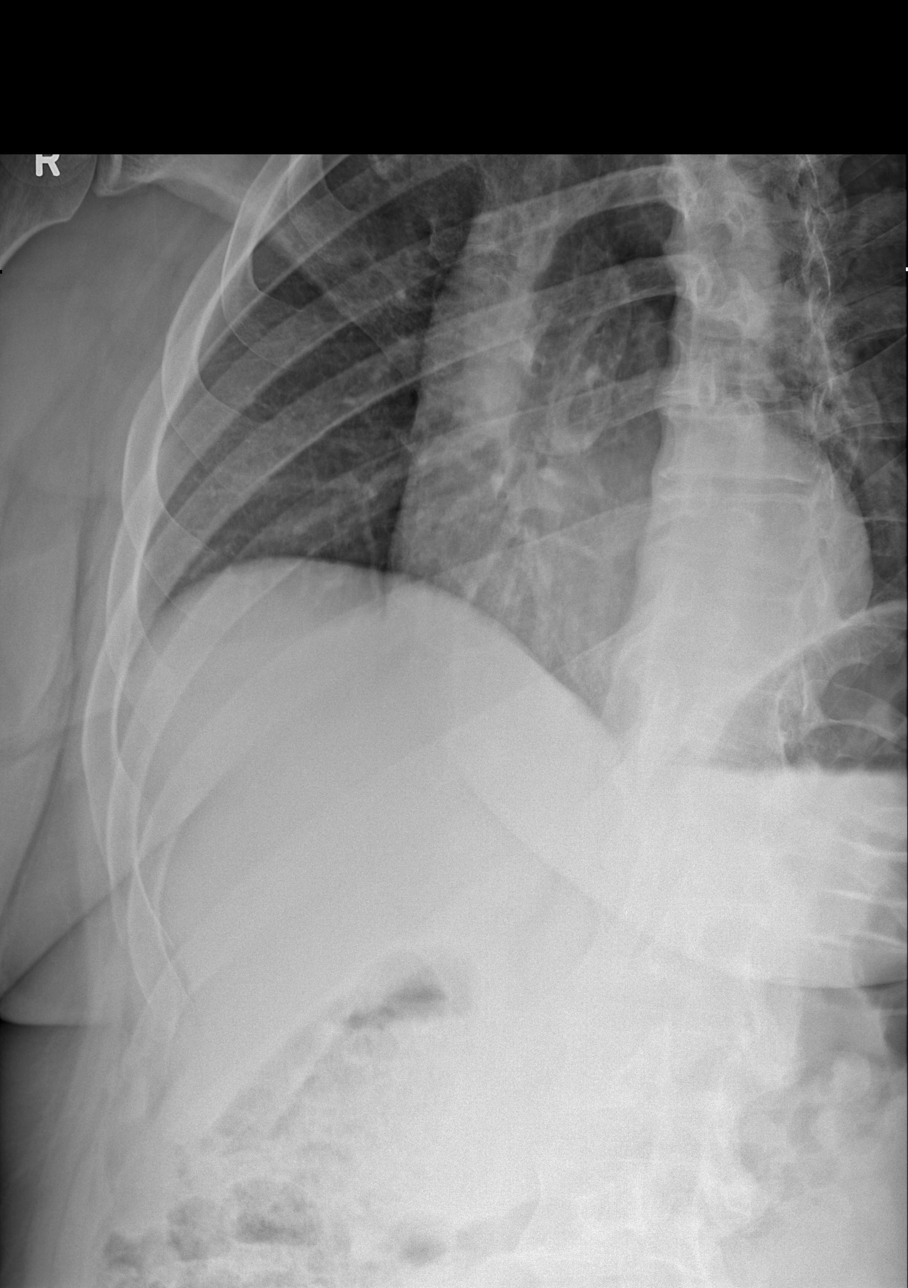

[3 of 3 positions shown; findings below may reference images not displayed]

FINDINGS: Slightly shallow inspiration with elevation of the left
hemidiaphragm.  Normal heart size and pulmonary vascularity.
Scattered fibrosis in the lungs.  No focal airspace consolidation.
No edema.  No blunting of costophrenic angles.  No pneumothorax.
Mediastinal contours appear intact.

Right ribs appear intact.  No displaced fractures or focal bone
lesions appreciated.  Mild degenerative changes in the spine.
IMPRESSION: No evidence of active pulmonary disease.  No displaced right rib
fractures.

## 2014-02-24 ENCOUNTER — Other Ambulatory Visit: Payer: Self-pay | Admitting: Family Medicine

## 2014-02-24 DIAGNOSIS — E785 Hyperlipidemia, unspecified: Secondary | ICD-10-CM

## 2014-02-24 NOTE — Telephone Encounter (Signed)
Dr Lorelei Pont, pt was in recently for ADD but hasn't had lipid panel done for over year. Do you want to RF or have pt RTC?

## 2014-02-25 NOTE — Telephone Encounter (Signed)
Called to let him know- future orders for FLP and CMP placed, he will have this done soon

## 2014-03-27 ENCOUNTER — Telehealth: Payer: Self-pay

## 2014-03-27 DIAGNOSIS — F988 Other specified behavioral and emotional disorders with onset usually occurring in childhood and adolescence: Secondary | ICD-10-CM

## 2014-03-27 MED ORDER — AMPHETAMINE-DEXTROAMPHETAMINE 20 MG PO TABS: ORAL_TABLET | ORAL | Status: DC

## 2014-03-27 NOTE — Telephone Encounter (Signed)
LMVM to CB. 

## 2014-03-27 NOTE — Telephone Encounter (Signed)
Adderall refill   [phone]

## 2014-03-27 NOTE — Telephone Encounter (Signed)
Called and left message per Dr. Lorelei Pont; she is going to  Refill med and have prescription left up front for him to pick up

## 2014-03-27 NOTE — Telephone Encounter (Signed)
Adderall 20 mg 3 x day. Needs one month refill.

## 2014-05-01 ENCOUNTER — Ambulatory Visit: Payer: BC Managed Care – PPO | Admitting: Family Medicine

## 2014-05-25 ENCOUNTER — Telehealth: Payer: Self-pay | Admitting: Family Medicine

## 2014-05-25 DIAGNOSIS — E785 Hyperlipidemia, unspecified: Secondary | ICD-10-CM

## 2014-05-25 MED ORDER — PRAVASTATIN SODIUM 40 MG PO TABS: 40.0000 mg | ORAL_TABLET | Freq: Every day | ORAL | Status: DC

## 2014-05-25 NOTE — Telephone Encounter (Signed)
Refill on Pravastatin. [facility]  [phone]

## 2014-05-25 NOTE — Telephone Encounter (Signed)
Sent in 30 day supply for pt- spoke to him to advise needs an appt- he states he will be in tomorrow.

## 2014-05-26 ENCOUNTER — Ambulatory Visit (INDEPENDENT_AMBULATORY_CARE_PROVIDER_SITE_OTHER): Payer: BC Managed Care – PPO | Admitting: Family Medicine

## 2014-05-26 VITALS — BP 130/90 | HR 89 | Temp 98.7°F | Resp 18 | Ht 71.0 in | Wt 320.0 lb

## 2014-05-26 DIAGNOSIS — F900 Attention-deficit hyperactivity disorder, predominantly inattentive type: Secondary | ICD-10-CM

## 2014-05-26 DIAGNOSIS — F988 Other specified behavioral and emotional disorders with onset usually occurring in childhood and adolescence: Secondary | ICD-10-CM

## 2014-05-26 DIAGNOSIS — E785 Hyperlipidemia, unspecified: Secondary | ICD-10-CM

## 2014-05-26 DIAGNOSIS — F909 Attention-deficit hyperactivity disorder, unspecified type: Secondary | ICD-10-CM

## 2014-05-26 LAB — COMPREHENSIVE METABOLIC PANEL
ALBUMIN: 4.1 g/dL (ref 3.5–5.2)
ALT: 33 U/L (ref 0–53)
AST: 22 U/L (ref 0–37)
Alkaline Phosphatase: 75 U/L (ref 39–117)
BUN: 15 mg/dL (ref 6–23)
CALCIUM: 9.4 mg/dL (ref 8.4–10.5)
CHLORIDE: 104 meq/L (ref 96–112)
CO2: 26 mEq/L (ref 19–32)
CREATININE: 1.03 mg/dL (ref 0.50–1.35)
GLUCOSE: 96 mg/dL (ref 70–99)
POTASSIUM: 4.2 meq/L (ref 3.5–5.3)
Sodium: 139 mEq/L (ref 135–145)
Total Bilirubin: 0.4 mg/dL (ref 0.2–1.2)
Total Protein: 7 g/dL (ref 6.0–8.3)

## 2014-05-26 LAB — CBC
HEMATOCRIT: 44.8 % (ref 39.0–52.0)
HEMOGLOBIN: 15.5 g/dL (ref 13.0–17.0)
MCH: 29.4 pg (ref 26.0–34.0)
MCHC: 34.6 g/dL (ref 30.0–36.0)
MCV: 84.8 fL (ref 78.0–100.0)
Platelets: 240 10*3/uL (ref 150–400)
RBC: 5.28 MIL/uL (ref 4.22–5.81)
RDW: 13.4 % (ref 11.5–15.5)
WBC: 6 10*3/uL (ref 4.0–10.5)

## 2014-05-26 LAB — LIPID PANEL
CHOL/HDL RATIO: 3 ratio
CHOLESTEROL: 180 mg/dL (ref 0–200)
HDL: 61 mg/dL (ref >39)
LDL Cholesterol: 101 mg/dL — ABNORMAL HIGH (ref 0–99)
TRIGLYCERIDES: 90 mg/dL (ref <150)
VLDL: 18 mg/dL (ref 0–40)

## 2014-05-26 MED ORDER — AMPHETAMINE-DEXTROAMPHETAMINE 20 MG PO TABS: ORAL_TABLET | ORAL | Status: DC

## 2014-05-26 MED ORDER — PRAVASTATIN SODIUM 40 MG PO TABS: 40.0000 mg | ORAL_TABLET | Freq: Every day | ORAL | Status: DC

## 2014-05-26 NOTE — Patient Instructions (Signed)
Good to see you today- I will be in touch with your labs asap

## 2014-05-26 NOTE — Progress Notes (Signed)
[facility] [address], [place] [address] [phone]  Date:  05/26/2014   Name:  Craig Jordan.   DOB:  Apr 13, 1971   MRN:  [id number]  PCP:  Lamar Blinks, MD    Chief Complaint: Medication Refill   History of Present Illness:  Craig Jordan. is a 43 y.o. very pleasant male patient who presents with the following:  Here today to discuss his pravachol.  He is doing well- he [place].   He is fasting this am.   He is doing ok with his adderall, sleeping well in general.   He is eating ok  Wt Readings from Last 3 Encounters:  05/26/14 320 lb (145.151 kg)  01/02/14 326 lb (147.873 kg)  06/08/13 319 lb (144.697 kg)     Patient Active Problem List   Diagnosis Date Noted  . Snoring 02/11/2013  . Chest pain, atypical 12/20/2012  . ADD (attention deficit disorder) 12/13/2012  . Obesity 12/13/2012    Past Medical History  Diagnosis Date  . ADHD (attention deficit hyperactivity disorder)   . Glaucoma   . Obesity     Past Surgical History  Procedure Laterality Date  . Knee surgery      arthroscopic    History  Substance Use Topics  . Smoking status: Former Smoker    Quit date: 05/17/1994  . Smokeless tobacco: Not on file  . Alcohol Use: 12.0 oz/week    20 Cans of beer per week    Family History  Problem Relation Age of Onset  . Seizures Father   . Sleep apnea Brother     No Known Allergies  Medication list has been reviewed and updated.  Current Outpatient Prescriptions on File Prior to Visit  Medication Sig Dispense Refill  . amphetamine-dextroamphetamine (ADDERALL) 20 MG tablet Take 2 po in the morning and 1 po in the afternoon.  90 tablet  0  . amphetamine-dextroamphetamine (ADDERALL) 20 MG tablet Take 2 po in the morning and 1 po in the afternoon.  90 tablet  0  . amphetamine-dextroamphetamine (ADDERALL) 20 MG tablet Take 2 po in the morning and 1 po in the afternoon.  90 tablet  0  .  ibuprofen (ADVIL,MOTRIN) 800 MG tablet Take 1 tablet (800 mg total) by mouth 3 (three) times daily.  21 tablet  0  . oxymetazoline (AFRIN) 0.05 % nasal spray Place 2 sprays into the nose at bedtime as needed. For nasal congestion      . pravastatin (PRAVACHOL) 40 MG tablet Take 1 tablet (40 mg total) by mouth daily. PATIENT NEEDS OFFICE VISIT/LABS FOR ADDITIONAL REFILLS  30 tablet  0  . Travoprost, BAK Free, (TRAVATAN) 0.004 % SOLN ophthalmic solution Place 1 drop into both eyes at bedtime.       No current facility-administered medications on file prior to visit.    Review of Systems:  As per HPI- otherwise negative.   Physical Examination: Filed Vitals:   05/26/14 0817  BP: 130/90  Pulse: 89  Temp: 98.7 F (37.1 C)  Resp: 18   Filed Vitals:   05/26/14 0817  Height: 5' 11"  (1.803 m)  Weight: 320 lb (145.151 kg)   Body mass index is 44.65 kg/(m^2). Ideal Body Weight: Weight in (lb) to have BMI = 25: 178.9  GEN: WDWN, NAD, Non-toxic, A & O x 3, obese, looks well HEENT: Atraumatic, Normocephalic. Neck supple. No masses, No LAD. Ears and Nose:  No external deformity. CV: RRR, No M/G/R. No JVD. No thrill. No extra heart sounds. PULM: CTA B, no wheezes, crackles, rhonchi. No retractions. No resp. distress. No accessory muscle use. EXTR: No c/c/e NEURO Normal gait.  PSYCH: Normally interactive. Conversant. Not depressed or anxious appearing.  Calm demeanor.    Assessment and Plan: Other and unspecified hyperlipidemia - Plan: CBC, Comprehensive metabolic panel, Lipid panel, pravastatin (PRAVACHOL) 40 MG tablet  Attention deficit hyperactivity disorder (ADHD), predominantly inattentive type  ADD (attention deficit disorder) - Plan: amphetamine-dextroamphetamine (ADDERALL) 20 MG tablet, amphetamine-dextroamphetamine (ADDERALL) 20 MG tablet, amphetamine-dextroamphetamine (ADDERALL) 20 MG tablet  Refilled his adderall and pravachol today, await labs.  He is fasting today for  labs. He is aware that he needs to lose weight, but this has been a continued struggle for her See patient instructions for more details.     Signed Lamar Blinks, MD

## 2014-05-27 ENCOUNTER — Encounter: Payer: Self-pay | Admitting: Family Medicine

## 2014-07-27 ENCOUNTER — Telehealth: Payer: Self-pay

## 2014-07-27 DIAGNOSIS — F988 Other specified behavioral and emotional disorders with onset usually occurring in childhood and adolescence: Secondary | ICD-10-CM

## 2014-07-27 NOTE — Telephone Encounter (Signed)
Refill amphetamine-dextroamphetamine (ADDERALL) 20 MG tablet   [phone]

## 2014-07-28 ENCOUNTER — Encounter (HOSPITAL_COMMUNITY): Payer: Self-pay | Admitting: Emergency Medicine

## 2014-07-28 ENCOUNTER — Emergency Department (HOSPITAL_COMMUNITY)
Admission: EM | Admit: 2014-07-28 | Discharge: 2014-07-29 | Disposition: A | Discharge status: Home / Self Care | Payer: BC Managed Care – PPO | Attending: Emergency Medicine | Admitting: Emergency Medicine

## 2014-07-28 DIAGNOSIS — E669 Obesity, unspecified: Secondary | ICD-10-CM | POA: Insufficient documentation

## 2014-07-28 DIAGNOSIS — R079 Chest pain, unspecified: Secondary | ICD-10-CM | POA: Insufficient documentation

## 2014-07-28 DIAGNOSIS — Z791 Long term (current) use of non-steroidal anti-inflammatories (NSAID): Secondary | ICD-10-CM | POA: Insufficient documentation

## 2014-07-28 DIAGNOSIS — Z79899 Other long term (current) drug therapy: Secondary | ICD-10-CM | POA: Diagnosis not present

## 2014-07-28 DIAGNOSIS — Z87891 Personal history of nicotine dependence: Secondary | ICD-10-CM | POA: Insufficient documentation

## 2014-07-28 DIAGNOSIS — R0602 Shortness of breath: Secondary | ICD-10-CM | POA: Diagnosis not present

## 2014-07-28 DIAGNOSIS — R61 Generalized hyperhidrosis: Secondary | ICD-10-CM | POA: Diagnosis not present

## 2014-07-28 DIAGNOSIS — F909 Attention-deficit hyperactivity disorder, unspecified type: Secondary | ICD-10-CM | POA: Insufficient documentation

## 2014-07-28 DIAGNOSIS — R0789 Other chest pain: Secondary | ICD-10-CM

## 2014-07-28 DIAGNOSIS — R071 Chest pain on breathing: Secondary | ICD-10-CM | POA: Insufficient documentation

## 2014-07-28 DIAGNOSIS — H409 Unspecified glaucoma: Secondary | ICD-10-CM | POA: Insufficient documentation

## 2014-07-28 DIAGNOSIS — E785 Hyperlipidemia, unspecified: Secondary | ICD-10-CM | POA: Insufficient documentation

## 2014-07-28 DIAGNOSIS — R110 Nausea: Secondary | ICD-10-CM | POA: Diagnosis not present

## 2014-07-28 HISTORY — DX: Hyperlipidemia, unspecified: E78.5

## 2014-07-28 LAB — CBC
HCT: 41.3 % (ref 39.0–52.0)
Hemoglobin: 14.1 g/dL (ref 13.0–17.0)
MCH: 29.6 pg (ref 26.0–34.0)
MCHC: 34.1 g/dL (ref 30.0–36.0)
MCV: 86.6 fL (ref 78.0–100.0)
PLATELETS: 216 10*3/uL (ref 150–400)
RBC: 4.77 MIL/uL (ref 4.22–5.81)
RDW: 12.4 % (ref 11.5–15.5)
WBC: 7.5 10*3/uL (ref 4.0–10.5)

## 2014-07-28 LAB — I-STAT TROPONIN, ED: TROPONIN I, POC: 0.01 ng/mL (ref 0.00–0.08)

## 2014-07-28 NOTE — Telephone Encounter (Signed)
Called him- it looks like I wrote rx for 8/15. 9/15 and 10/15 back in July.  LMOM- suspect this request in error.  Let me know if any other issues

## 2014-07-28 NOTE — ED Notes (Signed)
Patient arrives with complaint of chest pain. Reports that the chest pain has been ongoing for about 2 months with minimal relief. The primary reason for presenting this evening is accompanying symptoms. Additionally reports shortness of breath, dizziness, lightheadedness, paresthesia in left arm, diaphoresis, and ringing in ears. States that it has never been this bad. Has been seen by PCP for related pain but never in presence of these symptoms. Patient took 5x 1m ASA tablets at home PTA.

## 2014-07-29 LAB — BASIC METABOLIC PANEL
ANION GAP: 10 (ref 5–15)
BUN: 21 mg/dL (ref 6–23)
CALCIUM: 9.1 mg/dL (ref 8.4–10.5)
CO2: 26 mEq/L (ref 19–32)
Chloride: 103 mEq/L (ref 96–112)
Creatinine, Ser: 1.21 mg/dL (ref 0.50–1.35)
GFR calc Af Amer: 85 mL/min — ABNORMAL LOW (ref >90)
GFR, EST NON AFRICAN AMERICAN: 73 mL/min — AB (ref >90)
Glucose, Bld: 134 mg/dL — ABNORMAL HIGH (ref 70–99)
Potassium: 3.9 mEq/L (ref 3.7–5.3)
SODIUM: 139 meq/L (ref 137–147)

## 2014-07-29 MED ORDER — NAPROXEN 500 MG PO TABS: 500.0000 mg | ORAL_TABLET | Freq: Two times a day (BID) | ORAL | Status: DC

## 2014-07-29 MED ORDER — IBUPROFEN 800 MG PO TABS
800.0000 mg | ORAL_TABLET | Freq: Once | ORAL | Status: AC
  Administered 2014-07-29: 800 mg via ORAL
  Filled 2014-07-29: qty 1

## 2014-07-29 NOTE — ED Provider Notes (Signed)
CSN: [id number]     Arrival date & time 07/28/14  2317 History   First MD Initiated Contact with Patient 07/29/14 0019     Chief Complaint  Patient presents with  . Chest Pain  . Shortness of Breath  . Fatigue     (Consider location/radiation/quality/duration/timing/severity/associated sxs/prior Treatment) Patient is a 43 y.o. male presenting with chest pain and shortness of breath. The history is provided by the patient.  Chest Pain Associated symptoms: shortness of breath   Shortness of Breath Associated symptoms: chest pain   He has been having episodes of chest pain for the last several months. Pain comes in pulses and is on the left anterolateral chest with some radiation to the back. Pain will come on and be present for a few seconds before subsiding only to recur a minute or 2 later. When present, he rates pain at 8/10. Today, the pain was worse and was associated with some dyspnea, nausea, and diaphoresis. Symptoms generally are worse toward the end of the day and worse towards the end of the week. 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 He was evaluated for chest pain by cardiologist in about one year ago and had a stress test which was reportedly normal. He did take a dose of aspirin at home prior to coming into the ED. He is a former smoker having quit 20 years ago. There is no history of hypertension or diabetes. He does have history of hyperlipidemia. He denies family history of premature coronary atherosclerosis.  Past Medical History  Diagnosis Date  . ADHD (attention deficit hyperactivity disorder)   . Glaucoma   . Obesity   . Hyperlipidemia    Past Surgical History  Procedure Laterality Date  . Knee surgery      arthroscopic   Family History  Problem Relation Age of Onset  . Seizures Father   . Sleep apnea Brother    History  Substance Use Topics  . Smoking status: Former Smoker    Quit date: 05/17/1994  . Smokeless  tobacco: Not on file  . Alcohol Use: No     Comment: "Stopped 2 weeks ago" stated on 07/28/2014    Review of Systems  Respiratory: Positive for shortness of breath.   Cardiovascular: Positive for chest pain.  All other systems reviewed and are negative.     Allergies  Review of patient's allergies indicates no known allergies.  Home Medications   Prior to Admission medications   Medication Sig Start Date End Date Taking? Authorizing Provider  amphetamine-dextroamphetamine (ADDERALL) 20 MG tablet Take 2 po in the morning and 1 po in the afternoon. 05/26/14   Gay Filler Copland, MD  amphetamine-dextroamphetamine (ADDERALL) 20 MG tablet Take 2 po in the morning and 1 po in the afternoon. 05/26/14   Gay Filler Copland, MD  amphetamine-dextroamphetamine (ADDERALL) 20 MG tablet Take 2 po in the morning and 1 po in the afternoon. 05/26/14   Gay Filler Copland, MD  ibuprofen (ADVIL,MOTRIN) 800 MG tablet Take 1 tablet (800 mg total) by mouth 3 (three) times daily. 12/03/12   Perlie Mayo, MD  oxymetazoline (AFRIN) 0.05 % nasal spray Place 2 sprays into the nose at bedtime as needed. For nasal congestion    Historical Provider, MD  pravastatin (PRAVACHOL) 40 MG tablet Take 1 tablet (40 mg total) by mouth daily. 05/26/14   Gay Filler Copland, MD  Travoprost, BAK Free, (TRAVATAN) 0.004 % SOLN ophthalmic  solution Place 1 drop into both eyes at bedtime.    Historical Provider, MD   BP 119/70  Pulse 65  Temp(Src) 97.6 F (36.4 C) (Oral)  Resp 18  Ht 5' 11"  (1.803 m)  Wt 320 lb (145.151 kg)  BMI 44.65 kg/m2  SpO2 96% Physical Exam  Nursing note and vitals reviewed.  43 year old male, resting comfortably and in no acute distress. Vital signs are normal. Oxygen saturation is 96%, which is normal. Head is normocephalic and atraumatic. PERRLA, EOMI. Oropharynx is clear. Neck is nontender and supple without adenopathy or JVD. Back is nontender and there is no CVA tenderness. Lungs are clear without  rales, wheezes, or rhonchi. Chest is moderately tender in the left anterolateral chest wall. Heart has regular rate and rhythm without murmur. Abdomen is soft, flat, nontender without masses or hepatosplenomegaly and peristalsis is normoactive. Extremities have no cyanosis or edema, full range of motion is present. Skin is warm and dry without rash. Neurologic: Mental status is normal, cranial nerves are intact, there are no motor or sensory deficits.  ED Course  Procedures (including critical care time) Labs Review Results for orders placed during the hospital encounter of 07/28/14  CBC      Result Value Ref Range   WBC 7.5  4.0 - 10.5 K/uL   RBC 4.77  4.22 - 5.81 MIL/uL   Hemoglobin 14.1  13.0 - 17.0 g/dL   HCT 41.3  39.0 - 52.0 %   MCV 86.6  78.0 - 100.0 fL   MCH 29.6  26.0 - 34.0 pg   MCHC 34.1  30.0 - 36.0 g/dL   RDW 12.4  11.5 - 15.5 %   Platelets 216  150 - 400 K/uL  BASIC METABOLIC PANEL      Result Value Ref Range   Sodium 139  137 - 147 mEq/L   Potassium 3.9  3.7 - 5.3 mEq/L   Chloride 103  96 - 112 mEq/L   CO2 26  19 - 32 mEq/L   Glucose, Bld 134 (*) 70 - 99 mg/dL   BUN 21  6 - 23 mg/dL   Creatinine, Ser 1.21  0.50 - 1.35 mg/dL   Calcium 9.1  8.4 - 10.5 mg/dL   GFR calc non Af Amer 73 (*) >90 mL/min   GFR calc Af Amer 85 (*) >90 mL/min   Anion gap 10  5 - 15  I-STAT TROPOININ, ED      Result Value Ref Range   Troponin i, poc 0.01  0.00 - 0.08 ng/mL   Comment 3             EKG Interpretation   Date/Time:  Saturday July 29 2014 00:50:23 EDT Ventricular Rate:  62 PR Interval:  167 QRS Duration: 124 QT Interval:  412 QTC Calculation: 418 R Axis:   -31 Text Interpretation:  Sinus rhythm IVCD, consider atypical RBBB When  compared with ECG of 12/03/2012, No significant change was found Confirmed  by [facility]  MD, Roshanna Cimino ([id number]) on 07/29/2014 12:54:02 AM      MDM   Final diagnoses:  Chest wall pain    Chest pain which seems to be chest wall pain.  Patient was asked to take a deep breath and that did reproduce his pain. He was asked to twist to his right and also reproduces pain. All records are reviewed and when he was evaluated by cardiologist he was felt to have chest wall pain and description is very similar  to what he has today. He is given a dose of ibuprofen in the ED and is sent home with prescription for naproxen and is to followup with his PCP. Consider repeat stress testing as an outpatient. Incidentally noted is markedly elevated blood sugar.  Sugars have been normal. This will need to be followed as an outpatient and has been discussed with patient and with his spouse.    Delora Fuel, MD 61/22/44 [id number]

## 2014-07-29 NOTE — Discharge Instructions (Signed)
Chest Pain (Nonspecific) °It is often hard to give a specific diagnosis for the cause of chest pain. There is always a chance that your pain could be related to something serious, such as a heart attack or a blood clot in the lungs. You need to follow up with your health care provider for further evaluation. °CAUSES  °· Heartburn. °· Pneumonia or bronchitis. °· Anxiety or stress. °· Inflammation around your heart (pericarditis) or lung (pleuritis or pleurisy). °· A blood clot in the lung. °· A collapsed lung (pneumothorax). It can develop suddenly on its own (spontaneous pneumothorax) or from trauma to the chest. °· Shingles infection (herpes zoster virus). °The chest wall is composed of bones, muscles, and cartilage. Any of these can be the source of the pain. °· The bones can be bruised by injury. °· The muscles or cartilage can be strained by coughing or overwork. °· The cartilage can be affected by inflammation and become sore (costochondritis). °DIAGNOSIS  °Lab tests or other studies may be needed to find the cause of your pain. Your health care provider may have you take a test called an ambulatory electrocardiogram (ECG). An ECG records your heartbeat patterns over a 24-hour period. You may also have other tests, such as: °· Transthoracic echocardiogram (TTE). During echocardiography, sound waves are used to evaluate how blood flows through your heart. °· Transesophageal echocardiogram (TEE). °· Cardiac monitoring. This allows your health care provider to monitor your heart rate and rhythm in real time. °· Holter monitor. This is a portable device that records your heartbeat and can help diagnose heart arrhythmias. It allows your health care provider to track your heart activity for several days, if needed. °· Stress tests by exercise or by giving medicine that makes the heart beat faster. °TREATMENT  °· Treatment depends on what may be causing your chest pain. Treatment may include: °¨ Acid blockers for  heartburn. °¨ Anti-inflammatory medicine. °¨ Pain medicine for inflammatory conditions. °¨ Antibiotics if an infection is present. °· You may be advised to change lifestyle habits. This includes stopping smoking and avoiding alcohol, caffeine, and chocolate. °· You may be advised to keep your head raised (elevated) when sleeping. This reduces the chance of acid going backward from your stomach into your esophagus. °Most of the time, nonspecific chest pain will improve within 2-3 days with rest and mild pain medicine.  °HOME CARE INSTRUCTIONS  °· If antibiotics were prescribed, take them as directed. Finish them even if you start to feel better. °· For the next few days, avoid physical activities that bring on chest pain. Continue physical activities as directed. °· Do not use any tobacco products, including cigarettes, chewing tobacco, or electronic cigarettes. °· Avoid drinking alcohol. °· Only take medicine as directed by your health care provider. °· Follow your health care provider's suggestions for further testing if your chest pain does not go away. °· Keep any follow-up appointments you made. If you do not go to an appointment, you could develop lasting (chronic) problems with pain. If there is any problem keeping an appointment, call to reschedule. °SEEK MEDICAL CARE IF:  °· Your chest pain does not go away, even after treatment. °· You have a rash with blisters on your chest. °· You have a fever. °SEEK IMMEDIATE MEDICAL CARE IF:  °· You have increased chest pain or pain that spreads to your arm, neck, jaw, back, or abdomen. °· You have shortness of breath. °· You have an increasing cough, or you cough   up blood.  You have severe back or abdominal pain.  You feel nauseous or vomit.  You have severe weakness.  You faint.  You have chills. This is an emergency. Do not wait to see if the pain will go away. Get medical help at once. Call your local emergency services (911 in [place] Do not drive  yourself to the hospital. MAKE SURE YOU:   Understand these instructions.  Will watch your condition.  Will get help right away if you are not doing well or get worse. Document Released: 08/13/2005 Document Revised: 11/08/2013 Document Reviewed: 06/08/2008 [facility] Patient Information 2015 [place], [place]. This information is not intended to replace advice given to you by your health care provider. Make sure you discuss any questions you have with your health care provider.  Naproxen and naproxen sodium oral immediate-release tablets What is this medicine? NAPROXEN (na PROX en) is a non-steroidal anti-inflammatory drug (NSAID). It is used to reduce swelling and to treat pain. This medicine may be used for dental pain, headache, or painful monthly periods. It is also used for painful joint and muscular problems such as arthritis, tendinitis, bursitis, and gout. This medicine may be used for other purposes; ask your health care provider or pharmacist if you have questions. COMMON BRAND NAME(S): Aflaxen, Aleve, Aleve Arthritis, All Day Relief, Anaprox, Anaprox DS, Naprosyn What should I tell my health care provider before I take this medicine? They need to know if you have any of these conditions: -asthma -cigarette smoker -drink more than 3 alcohol containing drinks a day -heart disease or circulation problems such as heart failure or leg edema (fluid retention) -high blood pressure -kidney disease -liver disease -stomach bleeding or ulcers -an unusual or allergic reaction to naproxen, aspirin, other NSAIDs, other medicines, foods, dyes, or preservatives -pregnant or trying to get pregnant -breast-feeding How should I use this medicine? Take this medicine by mouth with a glass of water. Follow the directions on the prescription label. Take it with food if your stomach gets upset. Try to not lie down for at least 10 minutes after you take it. Take your medicine at regular intervals. Do not  take your medicine more often than directed. Long-term, continuous use may increase the risk of heart attack or stroke. A special MedGuide will be given to you by the pharmacist with each prescription and refill. Be sure to read this information carefully each time. Talk to your pediatrician regarding the use of this medicine in children. Special care may be needed. Overdosage: If you think you have taken too much of this medicine contact a poison control center or emergency room at once. NOTE: This medicine is only for you. Do not share this medicine with others. What if I miss a dose? If you miss a dose, take it as soon as you can. If it is almost time for your next dose, take only that dose. Do not take double or extra doses. What may interact with this medicine? -alcohol -aspirin -cidofovir -diuretics -lithium -methotrexate -other drugs for inflammation like ketorolac or prednisone -pemetrexed -probenecid -warfarin This list may not describe all possible interactions. Give your health care provider a list of all the medicines, herbs, non-prescription drugs, or dietary supplements you use. Also tell them if you smoke, drink alcohol, or use illegal drugs. Some items may interact with your medicine. What should I watch for while using this medicine? Tell your doctor or health care professional if your pain does not get better. Talk to your  doctor before taking another medicine for pain. Do not treat yourself. This medicine does not prevent heart attack or stroke. In fact, this medicine may increase the chance of a heart attack or stroke. The chance may increase with longer use of this medicine and in people who have heart disease. If you take aspirin to prevent heart attack or stroke, talk with your doctor or health care professional. Do not take other medicines that contain aspirin, ibuprofen, or naproxen with this medicine. Side effects such as stomach upset, nausea, or ulcers may be more  likely to occur. Many medicines available without a prescription should not be taken with this medicine. This medicine can cause ulcers and bleeding in the stomach and intestines at any time during treatment. Do not smoke cigarettes or drink alcohol. These increase irritation to your stomach and can make it more susceptible to damage from this medicine. Ulcers and bleeding can happen without warning symptoms and can cause death. You may get drowsy or dizzy. Do not drive, use machinery, or do anything that needs mental alertness until you know how this medicine affects you. Do not stand or sit up quickly, especially if you are an older patient. This reduces the risk of dizzy or fainting spells. This medicine can cause you to bleed more easily. Try to avoid damage to your teeth and gums when you brush or floss your teeth. What side effects may I notice from receiving this medicine? Side effects that you should report to your doctor or health care professional as soon as possible: -black or bloody stools, blood in the urine or vomit -blurred vision -chest pain -difficulty breathing or wheezing -nausea or vomiting -severe stomach pain -skin rash, skin redness, blistering or peeling skin, hives, or itching -slurred speech or weakness on one side of the body -swelling of eyelids, throat, lips -unexplained weight gain or swelling -unusually weak or tired -yellowing of eyes or skin Side effects that usually do not require medical attention (report to your doctor or health care professional if they continue or are bothersome): -constipation -headache -heartburn This list may not describe all possible side effects. Call your doctor for medical advice about side effects. You may report side effects to FDA at 1-800-FDA-1088. Where should I keep my medicine? Keep out of the reach of children. Store at room temperature between 15 and 30 degrees C (59 and 86 degrees F). Keep container tightly closed. Throw  away any unused medicine after the expiration date. NOTE: This sheet is a summary. It may not cover all possible information. If you have questions about this medicine, talk to your doctor, pharmacist, or health care provider.  2015, [employer]. (2009-11-05 20:10:16)

## 2014-07-29 NOTE — ED Notes (Signed)
Pt reports left sided chest pain that radiates from armpit to back. Pt states he has been having cp for a while and was seen by pcp and put on cholesterol medication. Pt reports cp was associated with sob, dizziness, ringing in ears, diaphoresis and tongue tingling. Per pt's wife pt was pale and fatigued unable to dress self to come here. Pt rates pain 8/10. Pt states pain is intermittent and sharp.

## 2014-07-29 NOTE — ED Notes (Signed)
Discharge instructions reviewed with pt. Pt verbalized understanding.   

## 2014-07-31 ENCOUNTER — Telehealth: Payer: Self-pay

## 2014-07-31 NOTE — Telephone Encounter (Signed)
[facility] - Pt had EKG and blood test after having chest pains this weekend.  Both said nothing was wrong and they told him he probably needed another stress test.  Please advise  [phone]

## 2014-07-31 NOTE — Telephone Encounter (Signed)
Spoke to Craig Jordan- He will only be able to come into the clinic to see Dr. Lorelei Pont on Monday Sept 21, 2015. He is going on vacation starting tomorrow. Advised Craig Jordan to decrease his use of Adderall while on vacation and avoid stimulants. He is aware to RTC sooner or go to nearest ED if chest pain presents.

## 2014-08-21 ENCOUNTER — Telehealth: Payer: Self-pay

## 2014-08-21 DIAGNOSIS — F988 Other specified behavioral and emotional disorders with onset usually occurring in childhood and adolescence: Secondary | ICD-10-CM

## 2014-08-21 MED ORDER — AMPHETAMINE-DEXTROAMPHETAMINE 20 MG PO TABS: 20.0000 mg | ORAL_TABLET | Freq: Three times a day (TID) | ORAL | Status: DC

## 2014-08-21 NOTE — Telephone Encounter (Signed)
Called and let him know- I did refill for him, but he is a bit early.  He still has some left, but wanted to get his RF before he ran out.  He will come and see me in the next few weeks.

## 2014-08-21 NOTE — Telephone Encounter (Signed)
Pt is requesting a refill of adderall.

## 2014-09-04 ENCOUNTER — Telehealth: Payer: Self-pay

## 2014-09-04 NOTE — Telephone Encounter (Signed)
Adderall refill. [phone]

## 2014-09-04 NOTE — Telephone Encounter (Signed)
Called him- I did these refills for him recently.  They are up front for pick up

## 2014-10-04 ENCOUNTER — Other Ambulatory Visit: Payer: Self-pay | Admitting: Family Medicine

## 2014-10-04 DIAGNOSIS — F988 Other specified behavioral and emotional disorders with onset usually occurring in childhood and adolescence: Secondary | ICD-10-CM

## 2014-10-04 NOTE — Telephone Encounter (Signed)
Adderall refill. [phone]

## 2014-10-04 NOTE — Telephone Encounter (Signed)
Called and LMOM.  On 10/5 I wrote #3 one month rx.  He should still be good on his adderall.  If any problems let me know

## 2014-10-09 ENCOUNTER — Telehealth: Payer: Self-pay

## 2014-10-09 NOTE — Telephone Encounter (Signed)
Pt states he have misplaced the last script of his ADDERALL 33ms, please call [phone]

## 2014-10-09 NOTE — Telephone Encounter (Signed)
Called him again and [facility]- he received #3 rx on 10/5, one to fill then, one for 30 days and one for 60 days.  If he filled an rx on 1/5 he should still be good for a couple of weeks.  If he wants me to replace an rx to fill on 12/5 I will consider this; please clarify for me

## 2014-10-16 ENCOUNTER — Telehealth: Payer: Self-pay

## 2014-10-16 DIAGNOSIS — F988 Other specified behavioral and emotional disorders with onset usually occurring in childhood and adolescence: Secondary | ICD-10-CM

## 2014-10-16 MED ORDER — AMPHETAMINE-DEXTROAMPHETAMINE 20 MG PO TABS: 20.0000 mg | ORAL_TABLET | Freq: Three times a day (TID) | ORAL | Status: DC

## 2014-10-16 NOTE — Telephone Encounter (Signed)
Rx printed.  Meds ordered this encounter  Medications  . amphetamine-dextroamphetamine (ADDERALL) 20 MG tablet    Sig: Take 1 tablet (20 mg total) by mouth 3 (three) times daily. May fill on/after 10/18/2014    Dispense:  90 tablet    Refill:  0    Order Specific Question:  Supervising Provider    Answer:  Tami Lin P [[id number]]

## 2014-10-16 NOTE — Telephone Encounter (Signed)
This pt seems to have lost the 3rd adderall rx that I wrote for him on 10/5.  He has not done this in the past and I am willing to give him the benefit of the doubt and replace one rx that he can fill on 12/2.  If you feel comfortable, would one of you be willing to write for his adderall and ask an assistant to call him to pick up?  If you would rather not please have someone ask him to come in and see me next week.    Thanks so much! Pierre Part

## 2014-10-16 NOTE — Telephone Encounter (Signed)
Dr Lorelei Pont   Patient has lost his amphetamine-dextroamphetamine (ADDERALL) 20 MG tablet Script  [phone]

## 2014-10-17 ENCOUNTER — Other Ambulatory Visit: Payer: Self-pay | Admitting: Family Medicine

## 2014-10-17 NOTE — Telephone Encounter (Signed)
Rx in pick up drawer Pt notified.

## 2014-11-14 ENCOUNTER — Telehealth: Payer: Self-pay

## 2014-11-14 DIAGNOSIS — F988 Other specified behavioral and emotional disorders with onset usually occurring in childhood and adolescence: Secondary | ICD-10-CM

## 2014-11-14 NOTE — Telephone Encounter (Signed)
Patient requesting a refill on "Adderall", has 2 pills left. Please call patient when ready to be picked up at [phone]

## 2014-11-15 ENCOUNTER — Telehealth: Payer: Self-pay | Admitting: Family Medicine

## 2014-11-15 DIAGNOSIS — F988 Other specified behavioral and emotional disorders with onset usually occurring in childhood and adolescence: Secondary | ICD-10-CM

## 2014-11-15 MED ORDER — AMPHETAMINE-DEXTROAMPHETAMINE 20 MG PO TABS: 20.0000 mg | ORAL_TABLET | Freq: Three times a day (TID) | ORAL | Status: DC

## 2014-11-15 NOTE — Telephone Encounter (Signed)
Called and let him know that his rx is ready.  Time for a recheck in the next month

## 2014-11-15 NOTE — Telephone Encounter (Signed)
Had to re-do his rx as my printer would not work.

## 2014-11-16 ENCOUNTER — Telehealth: Payer: Self-pay

## 2014-11-16 NOTE — Telephone Encounter (Signed)
Pt of Dr. Laney Pastor psharmacy is needing authorization on amphetamine-dextroamphetamine (ADDERALL) 20 MG tablet [[id number]] sending in authorization , please adive

## 2014-11-16 NOTE — Telephone Encounter (Signed)
Called Elhadj- problem is already resolved

## 2014-12-01 ENCOUNTER — Ambulatory Visit (HOSPITAL_COMMUNITY)
Admission: RE | Admit: 2014-12-01 | Discharge: 2014-12-01 | Disposition: A | Discharge status: Home / Self Care | Payer: BLUE CROSS/BLUE SHIELD | Source: Ambulatory Visit | Attending: Specialist | Admitting: Specialist

## 2014-12-01 ENCOUNTER — Other Ambulatory Visit (HOSPITAL_COMMUNITY): Payer: Self-pay | Admitting: Specialist

## 2014-12-01 DIAGNOSIS — M25562 Pain in left knee: Secondary | ICD-10-CM

## 2014-12-01 DIAGNOSIS — Z0389 Encounter for observation for other suspected diseases and conditions ruled out: Secondary | ICD-10-CM | POA: Insufficient documentation

## 2014-12-18 ENCOUNTER — Telehealth: Payer: Self-pay

## 2014-12-18 DIAGNOSIS — F988 Other specified behavioral and emotional disorders with onset usually occurring in childhood and adolescence: Secondary | ICD-10-CM

## 2014-12-18 MED ORDER — AMPHETAMINE-DEXTROAMPHETAMINE 20 MG PO TABS: 20.0000 mg | ORAL_TABLET | Freq: Three times a day (TID) | ORAL | Status: DC

## 2014-12-18 NOTE — Telephone Encounter (Signed)
Did one month RF and called him- let him know it is ready.  He will come in for a recheck prior to further RF as he is due

## 2014-12-18 NOTE — Telephone Encounter (Signed)
PT REQUESTING ADDERALL REFILL   BEST PHONE FOR PT IS [phone]

## 2015-01-17 ENCOUNTER — Telehealth: Payer: Self-pay

## 2015-01-17 DIAGNOSIS — F988 Other specified behavioral and emotional disorders with onset usually occurring in childhood and adolescence: Secondary | ICD-10-CM

## 2015-01-17 MED ORDER — AMPHETAMINE-DEXTROAMPHETAMINE 20 MG PO TABS: 20.0000 mg | ORAL_TABLET | Freq: Three times a day (TID) | ORAL | Status: DC

## 2015-01-17 NOTE — Telephone Encounter (Signed)
Pt in need of his ADDERALL 28ms. Please call [phone]

## 2015-01-17 NOTE — Telephone Encounter (Signed)
I had asked him to come in for a recheck when I last gave him one month of medication on 2/1.  Called and LMOM- I can give him #45 tablets but will not be able provide any further RF until he comes in

## 2015-02-05 ENCOUNTER — Ambulatory Visit (INDEPENDENT_AMBULATORY_CARE_PROVIDER_SITE_OTHER): Payer: BLUE CROSS/BLUE SHIELD | Admitting: Family Medicine

## 2015-02-05 ENCOUNTER — Encounter: Payer: Self-pay | Admitting: Family Medicine

## 2015-02-05 VITALS — BP 140/90 | HR 81 | Temp 98.0°F | Resp 16 | Ht 71.0 in | Wt 317.0 lb

## 2015-02-05 DIAGNOSIS — F909 Attention-deficit hyperactivity disorder, unspecified type: Secondary | ICD-10-CM | POA: Diagnosis not present

## 2015-02-05 DIAGNOSIS — R030 Elevated blood-pressure reading, without diagnosis of hypertension: Secondary | ICD-10-CM

## 2015-02-05 DIAGNOSIS — F988 Other specified behavioral and emotional disorders with onset usually occurring in childhood and adolescence: Secondary | ICD-10-CM

## 2015-02-05 DIAGNOSIS — H409 Unspecified glaucoma: Secondary | ICD-10-CM | POA: Insufficient documentation

## 2015-02-05 DIAGNOSIS — IMO0001 Reserved for inherently not codable concepts without codable children: Secondary | ICD-10-CM

## 2015-02-05 MED ORDER — AMPHETAMINE-DEXTROAMPHETAMINE 20 MG PO TABS: 20.0000 mg | ORAL_TABLET | Freq: Three times a day (TID) | ORAL | Status: DC

## 2015-02-05 NOTE — Progress Notes (Signed)
[facility] [address], [place] [address] [phone]  Date:  02/05/2015   Name:  Craig Jordan.   DOB:  Nov 12, 1971   MRN:  [id number]  PCP:  Lamar Blinks, MD    Chief Complaint: Medication Refill   History of Present Illness:  Craig Jordan. is a 44 y.o. very pleasant male patient who presents with the following:  Here today to follow-up ADHD medication.  He has been treated with adderall for some time.   Last cholesterol panel in July 2015; looked ok with his medication.   I gave him a 1/2 rx of his adderall on 01/17/15.  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 Lynann Bologna.   His family is doing well He thinks that he got a flu shot this past year but is not quite sure of the date.    Patient Active Problem List   Diagnosis Date Noted  . Snoring 02/11/2013  . Chest pain, atypical 12/20/2012  . ADD (attention deficit disorder) 12/13/2012  . Obesity 12/13/2012    Past Medical History  Diagnosis Date  . ADHD (attention deficit hyperactivity disorder)   . Glaucoma   . Obesity   . Hyperlipidemia     Past Surgical History  Procedure Laterality Date  . Knee surgery      arthroscopic    History  Substance Use Topics  . Smoking status: Current Some Day Smoker    Last Attempt to Quit: 05/17/1994  . Smokeless tobacco: Not on file  . Alcohol Use: No     Comment: "Stopped 2 weeks ago" stated on 07/28/2014    Family History  Problem Relation Age of Onset  . Seizures Father   . Sleep apnea Brother     No Known Allergies  Medication list has been reviewed and updated.  Current Outpatient Prescriptions on File Prior to Visit  Medication Sig Dispense Refill  .  amphetamine-dextroamphetamine (ADDERALL) 20 MG tablet Take 1 tablet (20 mg total) by mouth 3 (three) times daily. To fill 60 days after rx 90 tablet 0  . amphetamine-dextroamphetamine (ADDERALL) 20 MG tablet Take 1 tablet (20 mg total) by mouth 3 (three) times daily. 90 tablet 0  . amphetamine-dextroamphetamine (ADDERALL) 20 MG tablet Take 1 tablet (20 mg total) by mouth 3 (three) times daily. 45 tablet 0  . aspirin EC 81 MG tablet Take 405 mg by mouth once.    . naproxen (NAPROSYN) 500 MG tablet Take 1 tablet (500 mg total) by mouth 2 (two) times daily. 30 tablet 0  . oxymetazoline (AFRIN) 0.05 % nasal spray Place 2 sprays into the nose at bedtime as needed. For nasal congestion    . pravastatin (PRAVACHOL) 40 MG tablet Take 1 tablet (40 mg total) by mouth daily. 90 tablet 3  . Travoprost, BAK Free, (TRAVATAN) 0.004 % SOLN ophthalmic solution Place 1 drop into both eyes at bedtime.     No current facility-administered medications on file prior to visit.    Review of Systems:  As per HPI- otherwise negative.  BP Readings from Last 3 Encounters:  02/05/15 141/82  07/29/14 118/58  05/26/14 130/90  Physical Examination: Filed Vitals:   02/05/15 0832  BP: 141/82  Pulse: 81  Temp: 98 F (36.7 C)  Resp: 16   Filed Vitals:   02/05/15 0832  Height: 5' 11"  (1.803 m)  Weight: 317 lb (143.79 kg)   Body mass index is 44.23 kg/(m^2). Ideal Body Weight: Weight in (lb) to have BMI = 25: 178.9  GEN: WDWN, NAD, Non-toxic, A & O x 3, obese, looks well HEENT: Atraumatic, Normocephalic. Neck supple. No masses, No LAD. Ears and Nose: No external deformity. CV: RRR, No M/G/R. No JVD. No thrill. No extra heart sounds. PULM: CTA B, no wheezes, crackles, rhonchi. No retractions. No resp. distress. No accessory muscle use. EXTR: No c/c/e NEURO Normal gait.  PSYCH: Normally interactive. Conversant. Not depressed or anxious appearing.  Calm demeanor.    Assessment and Plan: ADD (attention  deficit disorder) - Plan: amphetamine-dextroamphetamine (ADDERALL) 20 MG tablet, amphetamine-dextroamphetamine (ADDERALL) 20 MG tablet, amphetamine-dextroamphetamine (ADDERALL) 20 MG tablet  Elevated BP  Glaucoma  Continue to see opthalmology for his glaucoma- this is stable per his report Refilled adderall for 3 months Continue cholesterol medication He will watch his BP at home, continue to work on his weight   Signed Lamar Blinks, MD

## 2015-02-05 NOTE — Patient Instructions (Signed)
Great to see you today as always.  Please let us know when you need more cholesterol medication, and let's plan for a physical and fasting labs in 6 months Your BP is a little up today; please keep an eye on it at the drug store and let me know if generally running higher than 135/85 Continue to work on your weight!

## 2015-05-22 ENCOUNTER — Telehealth: Payer: Self-pay

## 2015-05-22 DIAGNOSIS — F988 Other specified behavioral and emotional disorders with onset usually occurring in childhood and adolescence: Secondary | ICD-10-CM

## 2015-05-22 NOTE — Telephone Encounter (Signed)
I didn't know how many RFs, so pended for 3 as originally wrote.

## 2015-05-22 NOTE — Telephone Encounter (Signed)
Pt is needing a refill on his adderall

## 2015-05-23 MED ORDER — AMPHETAMINE-DEXTROAMPHETAMINE 20 MG PO TABS: 20.0000 mg | ORAL_TABLET | Freq: Three times a day (TID) | ORAL | Status: DC

## 2015-05-23 NOTE — Telephone Encounter (Signed)
Called and LMOM that this is ready for him

## 2015-08-21 ENCOUNTER — Telehealth: Payer: Self-pay

## 2015-08-21 DIAGNOSIS — F988 Other specified behavioral and emotional disorders with onset usually occurring in childhood and adolescence: Secondary | ICD-10-CM

## 2015-08-21 NOTE — Telephone Encounter (Signed)
PATIENT WOULD LIKE DR. COPLAND TO KNOW THAT IT IS TIME TO GET HIS PRESCRIPTION FOR ADDERALL 20 MG. PLEASE CALL HIM WHEN IT CAN BE PICKED UP. BEST PHONE [phone] (CELL)  Craig Jordan

## 2015-08-22 MED ORDER — AMPHETAMINE-DEXTROAMPHETAMINE 20 MG PO TABS: 20.0000 mg | ORAL_TABLET | Freq: Three times a day (TID) | ORAL | Status: DC

## 2015-08-22 NOTE — Telephone Encounter (Signed)
Called him- I will do a RF for one month but it is time for an appt He will call and schedule this asap

## 2015-09-11 ENCOUNTER — Ambulatory Visit (INDEPENDENT_AMBULATORY_CARE_PROVIDER_SITE_OTHER): Payer: BLUE CROSS/BLUE SHIELD | Admitting: Emergency Medicine

## 2015-09-11 VITALS — BP 118/82 | HR 76 | Temp 98.6°F | Resp 20 | Ht 70.5 in | Wt 314.4 lb

## 2015-09-11 DIAGNOSIS — Z23 Encounter for immunization: Secondary | ICD-10-CM | POA: Diagnosis not present

## 2015-09-11 DIAGNOSIS — F909 Attention-deficit hyperactivity disorder, unspecified type: Secondary | ICD-10-CM | POA: Diagnosis not present

## 2015-09-11 DIAGNOSIS — IMO0002 Reserved for concepts with insufficient information to code with codable children: Secondary | ICD-10-CM

## 2015-09-11 DIAGNOSIS — F988 Other specified behavioral and emotional disorders with onset usually occurring in childhood and adolescence: Secondary | ICD-10-CM

## 2015-09-11 DIAGNOSIS — S61011A Laceration without foreign body of right thumb without damage to nail, initial encounter: Secondary | ICD-10-CM | POA: Diagnosis not present

## 2015-09-11 MED ORDER — AMPHETAMINE-DEXTROAMPHETAMINE 20 MG PO TABS: 20.0000 mg | ORAL_TABLET | Freq: Three times a day (TID) | ORAL | Status: DC

## 2015-09-11 NOTE — Progress Notes (Signed)
Subjective:  Patient ID: Craig Jordan., male    DOB: [birth year]  Age: 44 y.o. MRN: [id number]  CC: Puncture Wound and Immunizations   HPI [name]. presents  he's come in with a laceration of his right thumb. He is not current on tetanus his last tetanus was 2006. He also is taking Adderall for ADD he's stable and 60 mg daily dose and is requesting a refill. He denies any other complaints denies any trouble sleeping or with appetite or weight loss with the medication is tolerating it well  History [name] has a past medical history of ADHD (attention deficit hyperactivity disorder); Glaucoma; Obesity; and Hyperlipidemia.   He has past surgical history that includes Knee surgery.   His  family history includes Seizures in his father; Sleep apnea in his brother.  He   reports that he quit smoking about 21 years ago. He does not have any smokeless tobacco history on file. He reports that he uses illicit drugs (Marijuana) about 7 times per week. He reports that he does not drink alcohol.  Outpatient Prescriptions Prior to Visit  Medication Sig Dispense Refill  . aspirin EC 81 MG tablet Take 405 mg by mouth once.    Marland Kitchen oxymetazoline (AFRIN) 0.05 % nasal spray Place 2 sprays into the nose at bedtime as needed. For nasal congestion    . pravastatin (PRAVACHOL) 40 MG tablet Take 1 tablet (40 mg total) by mouth daily. 90 tablet 3  . Travoprost, BAK Free, (TRAVATAN) 0.004 % SOLN ophthalmic solution Place 1 drop into both eyes at bedtime.    Marland Kitchen amphetamine-dextroamphetamine (ADDERALL) 20 MG tablet Take 1 tablet (20 mg total) by mouth 3 (three) times daily. To fill 30 days after rx 90 tablet 0  . naproxen (NAPROSYN) 500 MG tablet Take 1 tablet (500 mg total) by mouth 2 (two) times daily. (Patient not taking: Reported on 09/11/2015) 30 tablet 0  . amphetamine-dextroamphetamine (ADDERALL) 20 MG tablet Take 1 tablet (20 mg total) by mouth 3 (three) times daily. To fill 60 days after rx  90 tablet 0  . amphetamine-dextroamphetamine (ADDERALL) 20 MG tablet Take 1 tablet (20 mg total) by mouth 3 (three) times daily. 90 tablet 0   No facility-administered medications prior to visit.    Social History   Social History  . Marital Status: Single    Spouse Name: N/A  . Number of Children: 0  . Years of Education: N/A   Occupational History  . LANDSCAPING    Social History Main Topics  . Smoking status: Former Smoker    Quit date: 05/17/1994  . Smokeless tobacco: None  . Alcohol Use: No     Comment: "Stopped 2 weeks ago" stated on 07/28/2014  . Drug Use: 7.00 per week    Special: Marijuana  . Sexual Activity: Yes    Birth Control/ Protection: Abstinence   Other Topics Concern  . None   Social History Narrative     Review of Systems  Constitutional: Negative for fever, chills and appetite change.  HENT: Negative for congestion, ear pain, postnasal drip, sinus pressure and sore throat.   Eyes: Negative for pain and redness.  Respiratory: Negative for cough, shortness of breath and wheezing.   Cardiovascular: Negative for leg swelling.  Gastrointestinal: Negative for nausea, vomiting, abdominal pain, diarrhea, constipation and blood in stool.  Endocrine: Negative for polyuria.  Genitourinary: Negative for dysuria, urgency, frequency and flank pain.  Musculoskeletal: Negative for gait problem.  Skin:  Negative for rash.  Neurological: Negative for weakness and headaches.  Psychiatric/Behavioral: Negative for confusion and decreased concentration. The patient is not nervous/anxious.     Objective:  BP 118/82 mmHg  Pulse 76  Temp(Src) 98.6 F (37 C) (Oral)  Resp 20  Ht 5' 10.5" (1.791 m)  Wt 314 lb 6.4 oz (142.611 kg)  BMI 44.46 kg/m2  SpO2 99%  Physical Exam  Constitutional: He is oriented to person, place, and time. He appears well-developed and well-nourished.  HENT:  Head: Normocephalic and atraumatic.  Eyes: Conjunctivae are normal. Pupils are  equal, round, and reactive to light.  Pulmonary/Chest: Effort normal.  Musculoskeletal: He exhibits no edema.  Neurological: He is alert and oriented to person, place, and time.  Skin: Skin is dry. Laceration noted.  Psychiatric: He has a normal mood and affect. His behavior is normal. Thought content normal.   He has a meter linear laceration on the lateral aspect of his right thumb terminal phalanx. There is no neurovascular or tendon injury. There is no foreign body.   Assessment & Plan:   Damyan was seen today for puncture wound and immunizations.  Diagnoses and all orders for this visit:  ADD (attention deficit disorder) -     amphetamine-dextroamphetamine (ADDERALL) 20 MG tablet; Take 1 tablet (20 mg total) by mouth 3 (three) times daily.  Flu vaccine need -     Flu Vaccine QUAD 36+ mos IM  Laceration -     Tdap vaccine greater than or equal to 7yo IM   I have discontinued Mr. Glazier amphetamine-dextroamphetamine and amphetamine-dextroamphetamine. I have also changed his amphetamine-dextroamphetamine. Additionally, I am having him maintain his Travoprost (BAK Free), oxymetazoline, pravastatin, aspirin EC, and naproxen.  Meds ordered this encounter  Medications  . amphetamine-dextroamphetamine (ADDERALL) 20 MG tablet    Sig: Take 1 tablet (20 mg total) by mouth 3 (three) times daily.    Dispense:  90 tablet    Refill:  0    Appropriate red flag conditions were discussed with the patient as well as actions that should be taken.  Patient expressed his understanding.  Follow-up: Return in 1 week (on 09/18/2015).  Roselee Culver, MD

## 2015-09-11 NOTE — Progress Notes (Signed)
PROCEDURE NOTE: laceration repair Verbal consent obtained from patient.  Local anesthesia with 2cc 0.5% Marcaine.  Wound explored for tendon, ligament damage. Wound scrubbed with soap and water and rinsed. Wound closed with #3 4-0 Ethilon simple interrupted sutures. Wound cleansed and dressed.  Jaynee Eagles, PA-C [facility] [phone] 09/11/2015  4:12 PM

## 2015-09-11 NOTE — Patient Instructions (Signed)
Tdap Vaccine (Tetanus, Diphtheria and Pertussis): What You Need to Know 1. Why get vaccinated? Tetanus, diphtheria and pertussis are very serious diseases. Tdap vaccine can protect us from these diseases. And, Tdap vaccine given to pregnant women can protect newborn babies against pertussis. TETANUS (Lockjaw) is rare in the [place] today. It causes painful muscle tightening and stiffness, usually all over the body.  It can lead to tightening of muscles in the head and neck so you can't open your mouth, swallow, or sometimes even breathe. Tetanus kills about 1 out of 10 people who are infected even after receiving the best medical care. DIPHTHERIA is also rare in the [place] today. It can cause a thick coating to form in the back of the throat.  It can lead to breathing problems, heart failure, paralysis, and death. PERTUSSIS (Whooping Cough) causes severe coughing spells, which can cause difficulty breathing, vomiting and disturbed sleep.  It can also lead to weight loss, incontinence, and rib fractures. Up to 2 in 100 adolescents and 5 in 100 adults with pertussis are hospitalized or have complications, which could include pneumonia or death. These diseases are caused by bacteria. Diphtheria and pertussis are spread from person to person through secretions from coughing or sneezing. Tetanus enters the body through cuts, scratches, or wounds. Before vaccines, as many as 200,000 cases of diphtheria, 200,000 cases of pertussis, and hundreds of cases of tetanus, were reported in the [place] each year. Since vaccination began, reports of cases for tetanus and diphtheria have dropped by about 99% and for pertussis by about 80%. 2. Tdap vaccine Tdap vaccine can protect adolescents and adults from tetanus, diphtheria, and pertussis. One dose of Tdap is routinely given at age 11 or 12. People who did not get Tdap at that age should get it as soon as possible. Tdap is especially important  for healthcare professionals and anyone having close contact with a baby younger than 12 months. Pregnant women should get a dose of Tdap during every pregnancy, to protect the newborn from pertussis. Infants are most at risk for severe, life-threatening complications from pertussis. Another vaccine, called Td, protects against tetanus and diphtheria, but not pertussis. A Td booster should be given every 10 years. Tdap may be given as one of these boosters if you have never gotten Tdap before. Tdap may also be given after a severe cut or burn to prevent tetanus infection. Your doctor or the person giving you the vaccine can give you more information. Tdap may safely be given at the same time as other vaccines. 3. Some people should not get this vaccine  A person who has ever had a life-threatening allergic reaction after a previous dose of any diphtheria, tetanus or pertussis containing vaccine, OR has a severe allergy to any part of this vaccine, should not get Tdap vaccine. Tell the person giving the vaccine about any severe allergies.  Anyone who had coma or long repeated seizures within 7 days after a childhood dose of DTP or DTaP, or a previous dose of Tdap, should not get Tdap, unless a cause other than the vaccine was found. They can still get Td.  Talk to your doctor if you:  have seizures or another nervous system problem,  had severe pain or swelling after any vaccine containing diphtheria, tetanus or pertussis,  ever had a condition called Guillain-Barr Syndrome (GBS),  aren't feeling well on the day the shot is scheduled. 4. Risks With any medicine, including vaccines, there is   a chance of side effects. These are usually mild and go away on their own. Serious reactions are also possible but are rare. Most people who get Tdap vaccine do not have any problems with it. Mild problems following Tdap (Did not interfere with activities)  Pain where the shot was given (about 3 in 4  adolescents or 2 in 3 adults)  Redness or swelling where the shot was given (about 1 person in 5)  Mild fever of at least 100.4F (up to about 1 in 25 adolescents or 1 in 100 adults)  Headache (about 3 or 4 people in 10)  Tiredness (about 1 person in 3 or 4)  Nausea, vomiting, diarrhea, stomach ache (up to 1 in 4 adolescents or 1 in 10 adults)  Chills, sore joints (about 1 person in 10)  Body aches (about 1 person in 3 or 4)  Rash, swollen glands (uncommon) Moderate problems following Tdap (Interfered with activities, but did not require medical attention)  Pain where the shot was given (up to 1 in 5 or 6)  Redness or swelling where the shot was given (up to about 1 in 16 adolescents or 1 in 12 adults)  Fever over 102F (about 1 in 100 adolescents or 1 in 250 adults)  Headache (about 1 in 7 adolescents or 1 in 10 adults)  Nausea, vomiting, diarrhea, stomach ache (up to 1 or 3 people in 100)  Swelling of the entire arm where the shot was given (up to about 1 in 500). Severe problems following Tdap (Unable to perform usual activities; required medical attention)  Swelling, severe pain, bleeding and redness in the arm where the shot was given (rare). Problems that could happen after any vaccine:  People sometimes faint after a medical procedure, including vaccination. Sitting or lying down for about 15 minutes can help prevent fainting, and injuries caused by a fall. Tell your doctor if you feel dizzy, or have vision changes or ringing in the ears.  Some people get severe pain in the shoulder and have difficulty moving the arm where a shot was given. This happens very rarely.  Any medication can cause a severe allergic reaction. Such reactions from a vaccine are very rare, estimated at fewer than 1 in a million doses, and would happen within a few minutes to a few hours after the vaccination. As with any medicine, there is a very remote chance of a vaccine causing a serious  injury or death. The safety of vaccines is always being monitored. For more information, visit: www.cdc.gov/vaccinesafety/ 5. What if there is a serious problem? What should I look for?  Look for anything that concerns you, such as signs of a severe allergic reaction, very high fever, or unusual behavior.  Signs of a severe allergic reaction can include hives, swelling of the face and throat, difficulty breathing, a fast heartbeat, dizziness, and weakness. These would usually start a few minutes to a few hours after the vaccination. What should I do?  If you think it is a severe allergic reaction or other emergency that can't wait, call 9-1-1 or get the person to the nearest hospital. Otherwise, call your doctor.  Afterward, the reaction should be reported to the Vaccine Adverse Event Reporting System (VAERS). Your doctor might file this report, or you can do it yourself through the VAERS web site at www.vaers.hhs.gov, or by calling 1-800-822-7967. VAERS does not give medical advice.  6. The National Vaccine Injury Compensation Program The National Vaccine Injury Compensation Program (  VICP) is a federal program that was created to compensate people who may have been injured by certain vaccines. Persons who believe they may have been injured by a vaccine can learn about the program and about filing a claim by calling 1-800-338-2382 or visiting the VICP website at www.hrsa.gov/vaccinecompensation. There is a time limit to file a claim for compensation. 7. How can I learn more?  Ask your doctor. He or she can give you the vaccine package insert or suggest other sources of information.  Call your local or state health department.  Contact the Centers for Disease Control and Prevention (CDC):  Call 1-800-232-4636 (1-800-CDC-INFO) or  Visit CDC's website at www.cdc.gov/vaccines CDC Tdap Vaccine VIS (01/10/14)   This information is not intended to replace advice given to you by your health care  provider. Make sure you discuss any questions you have with your health care provider.   Document Released: 05/04/2012 Document Revised: 11/24/2014 Document Reviewed: 02/15/2014 Elsevier Interactive Patient Education 2016 [employer].  

## 2015-09-27 ENCOUNTER — Ambulatory Visit (INDEPENDENT_AMBULATORY_CARE_PROVIDER_SITE_OTHER): Payer: BLUE CROSS/BLUE SHIELD | Admitting: Urgent Care

## 2015-09-27 ENCOUNTER — Ambulatory Visit (INDEPENDENT_AMBULATORY_CARE_PROVIDER_SITE_OTHER): Payer: BLUE CROSS/BLUE SHIELD

## 2015-09-27 VITALS — BP 126/80 | HR 105 | Temp 98.5°F | Resp 17 | Ht 70.5 in | Wt 315.0 lb

## 2015-09-27 DIAGNOSIS — M7731 Calcaneal spur, right foot: Secondary | ICD-10-CM | POA: Diagnosis not present

## 2015-09-27 DIAGNOSIS — M79671 Pain in right foot: Secondary | ICD-10-CM | POA: Diagnosis not present

## 2015-09-27 DIAGNOSIS — E669 Obesity, unspecified: Secondary | ICD-10-CM | POA: Diagnosis not present

## 2015-09-27 MED ORDER — NAPROXEN SODIUM 550 MG PO TABS: 550.0000 mg | ORAL_TABLET | Freq: Two times a day (BID) | ORAL | Status: DC

## 2015-09-27 NOTE — Patient Instructions (Signed)
Plantar Fasciitis Plantar fasciitis is a painful foot condition that affects the heel. It occurs when the band of tissue that connects the toes to the heel bone (plantar fascia) becomes irritated. This can happen after exercising too much or doing other repetitive activities (overuse injury). The pain from plantar fasciitis can range from mild irritation to severe pain that makes it difficult for you to walk or move. The pain is usually worse in the morning or after you have been sitting or lying down for a while. CAUSES This condition may be caused by:  Standing for long periods of time.  Wearing shoes that do not fit.  Doing high-impact activities, including running, aerobics, and ballet.  Being overweight.  Having an abnormal way of walking (gait).  Having tight calf muscles.  Having high arches in your feet.  Starting a new athletic activity. SYMPTOMS The main symptom of this condition is heel pain. Other symptoms include:  Pain that gets worse after activity or exercise.  Pain that is worse in the morning or after resting.  Pain that goes away after you walk for a few minutes. DIAGNOSIS This condition may be diagnosed based on your signs and symptoms. Your health care provider will also do a physical exam to check for:  A tender area on the bottom of your foot.  A high arch in your foot.  Pain when you move your foot.  Difficulty moving your foot. You may also need to have imaging studies to confirm the diagnosis. These can include:  X-rays.  Ultrasound.  MRI. TREATMENT  Treatment for plantar fasciitis depends on the severity of the condition. Your treatment may include:  Rest, ice, and over-the-counter pain medicines to manage your pain.  Exercises to stretch your calves and your plantar fascia.  A splint that holds your foot in a stretched, upward position while you sleep (night splint).  Physical therapy to relieve symptoms and prevent problems in the  future.  Cortisone injections to relieve severe pain.  Extracorporeal shock wave therapy (ESWT) to stimulate damaged plantar fascia with electrical impulses. It is often used as a last resort before surgery.  Surgery, if other treatments have not worked after 12 months. HOME CARE INSTRUCTIONS  Take medicines only as directed by your health care provider.  Avoid activities that cause pain.  Roll the bottom of your foot over a bag of ice or a bottle of cold water. Do this for 20 minutes, 3-4 times a day.  Perform simple stretches as directed by your health care provider.  Try wearing athletic shoes with air-sole or gel-sole cushions or soft shoe inserts.  Wear a night splint while sleeping, if directed by your health care provider.  Keep all follow-up appointments with your health care provider. PREVENTION   Do not perform exercises or activities that cause heel pain.  Consider finding low-impact activities if you continue to have problems.  Lose weight if you need to. The best way to prevent plantar fasciitis is to avoid the activities that aggravate your plantar fascia. SEEK MEDICAL CARE IF:  Your symptoms do not go away after treatment with home care measures.  Your pain gets worse.  Your pain affects your ability to move or do your daily activities.   This information is not intended to replace advice given to you by your health care provider. Make sure you discuss any questions you have with your health care provider.   Document Released: 07/29/2001 Document Revised: 07/25/2015 Document Reviewed: 09/13/2014 Elsevier  Interactive Patient Education [employer].

## 2015-09-27 NOTE — Progress Notes (Signed)
MRN: [id number] DOB: August 17, 1971  Subjective:   Craig Jordan. is a 44 y.o. male presenting for chief complaint of Wound Check and Foot Pain  Foot pain - reports ~1 year history of right heel pain over plantar surface, pain is constant, achy and worse after work day at night. Patient has tried wearing good support with his shoes. Has had this problem before. Works in Biomedical scientist, self-employed, has very demanding and physical work, is on his feet all day. Denies fever, erythema, swelling, trauma, numbness or tingling in his feet.   Suture removal - patient initially seen on 09/11/2015 for right thumb laceration. He had a total of 3 sutures placed. Denies fever, erythema, swelling, drainage pus or bleeding, wound dehiscence.   Denies any other aggravating or relieving factors, no other questions or concerns.  [name] has a current medication list which includes the following prescription(s): amphetamine-dextroamphetamine, aspirin ec, oxymetazoline, pravastatin, and travoprost (bak free). Also has No Known Allergies.  [name]  has a past medical history of ADHD (attention deficit hyperactivity disorder); Glaucoma; Obesity; and Hyperlipidemia. Also  has past surgical history that includes Knee surgery.  Objective:   Vitals: BP 126/80 mmHg  Pulse 105  Temp(Src) 98.5 F (36.9 C) (Oral)  Resp 17  Ht 5' 10.5" (1.791 m)  Wt 315 lb (142.883 kg)  BMI 44.54 kg/m2  SpO2 97%  Pulse 88 on recheck by PA-Craig Jordan.  Physical Exam  Constitutional: He is oriented to person, place, and time. He appears well-developed and well-nourished.  Cardiovascular: Normal rate.   Pulmonary/Chest: Effort normal.  Musculoskeletal:       Right ankle: He exhibits normal range of motion, no swelling, no ecchymosis, no deformity, no laceration and normal pulse. No tenderness.       Hands:      Right foot: There is tenderness (over mid plantar surface of anterior heel, use diagram for reference). There is normal  range of motion, no bony tenderness, no swelling, normal capillary refill, no crepitus, no deformity and no laceration.       Feet:  Neurological: He is alert and oriented to person, place, and time.  Skin: Skin is warm and dry. No rash noted. No erythema. No pallor.   [facility] reading by PA-Craig Jordan. Right foot - osteophyte over anterior calcaneus, loose body over superior portion of calcaneus, posterior to talus, please comment.  Dg Foot Complete Right  09/27/2015  CLINICAL DATA:  Right heel pain, no report of injury. EXAM: RIGHT FOOT COMPLETE - 3+ VIEW COMPARISON:  None in PACs FINDINGS: Three views of the right foot reveal the bones to be adequately mineralized. The phalanges and metatarsals are intact. The tarsal bones exhibit no acute abnormalities. There is a prominent calcaneal spur. No Achilles region spur is observed. There is bony density that projects in the region of the os trigonum 0 which is likely exophytic from the dorsum of the talus posteriorly. This is not related to the normal insertion of the Achilles tendon. IMPRESSION: 1. Prominent plantar calcaneal spur. No acute bony abnormality is observed elsewhere. 2. Probable exostosis from the dorsum of the posterior talus. If there are symptoms referable to the posterior aspect of the ankle, MRI would be a useful next imaging step. Electronically Signed   By: Craig  Jordan M.D.   On: 09/27/2015 11:09   Assessment and Plan :   1. Calcaneal spur of foot, right 2. Heel pain, right - Likely undergoing plantar fasciitis. Start Anaprox twice daily with food. Recommended  rest, heel splint, gel pads for his shoes, ice baths after work since patient cannot take time from work or modify his activities. Will refer to orto for further management including possible injections or other orthotics. Patient agreed.  3. Obesity - Recommended weight loss to a low-impact exercise. Followup at annual exam.  Craig Eagles, PA-C [facility] [phone] 09/27/2015 10:37 AM

## 2015-10-03 ENCOUNTER — Encounter: Payer: BLUE CROSS/BLUE SHIELD | Admitting: Urgent Care

## 2015-10-16 ENCOUNTER — Telehealth: Payer: Self-pay

## 2015-10-16 DIAGNOSIS — F988 Other specified behavioral and emotional disorders with onset usually occurring in childhood and adolescence: Secondary | ICD-10-CM

## 2015-10-16 DIAGNOSIS — E785 Hyperlipidemia, unspecified: Secondary | ICD-10-CM

## 2015-10-16 NOTE — Telephone Encounter (Signed)
Patient needs a refills provastatin and adderral.

## 2015-10-17 MED ORDER — PRAVASTATIN SODIUM 40 MG PO TABS: 40.0000 mg | ORAL_TABLET | Freq: Every day | ORAL | Status: DC

## 2015-10-17 MED ORDER — AMPHETAMINE-DEXTROAMPHETAMINE 20 MG PO TABS: 20.0000 mg | ORAL_TABLET | Freq: Three times a day (TID) | ORAL | Status: DC

## 2015-10-17 NOTE — Telephone Encounter (Signed)
Called and discussed with him- refilled pravastatin and adderall for one month- he needs an OV.  He will come in- labs overdue

## 2015-11-16 ENCOUNTER — Telehealth: Payer: Self-pay

## 2015-11-16 DIAGNOSIS — F988 Other specified behavioral and emotional disorders with onset usually occurring in childhood and adolescence: Secondary | ICD-10-CM

## 2015-11-16 NOTE — Telephone Encounter (Signed)
Patient request for a refill of Adderall [phone]

## 2015-11-19 MED ORDER — AMPHETAMINE-DEXTROAMPHETAMINE 20 MG PO TABS: 20.0000 mg | ORAL_TABLET | Freq: Three times a day (TID) | ORAL | Status: DC

## 2015-11-19 NOTE — Telephone Encounter (Signed)
Called and LMOM that rx is ready to pick up

## 2015-12-07 ENCOUNTER — Encounter: Payer: Self-pay | Admitting: Family Medicine

## 2015-12-12 ENCOUNTER — Encounter: Payer: Self-pay | Admitting: Family Medicine

## 2016-01-15 ENCOUNTER — Ambulatory Visit (INDEPENDENT_AMBULATORY_CARE_PROVIDER_SITE_OTHER): Payer: BLUE CROSS/BLUE SHIELD | Admitting: Family Medicine

## 2016-01-15 ENCOUNTER — Encounter: Payer: Self-pay | Admitting: Family Medicine

## 2016-01-15 VITALS — BP 120/88 | HR 96 | Temp 98.8°F | Resp 16 | Ht 70.5 in | Wt 320.0 lb

## 2016-01-15 DIAGNOSIS — N469 Male infertility, unspecified: Secondary | ICD-10-CM | POA: Diagnosis not present

## 2016-01-15 DIAGNOSIS — Z131 Encounter for screening for diabetes mellitus: Secondary | ICD-10-CM

## 2016-01-15 DIAGNOSIS — F988 Other specified behavioral and emotional disorders with onset usually occurring in childhood and adolescence: Secondary | ICD-10-CM

## 2016-01-15 DIAGNOSIS — F909 Attention-deficit hyperactivity disorder, unspecified type: Secondary | ICD-10-CM

## 2016-01-15 DIAGNOSIS — E7800 Pure hypercholesterolemia, unspecified: Secondary | ICD-10-CM | POA: Diagnosis not present

## 2016-01-15 MED ORDER — AMPHETAMINE-DEXTROAMPHETAMINE 20 MG PO TABS: 20.0000 mg | ORAL_TABLET | Freq: Three times a day (TID) | ORAL | Status: DC

## 2016-01-15 NOTE — Progress Notes (Signed)
Subjective:    Patient ID: Craig Jordan., male    DOB: 1971-10-24, 45 y.o.   MRN: [id number]  01/15/2016  Medication Refill   HPI This 45 y.o. male presents for follow-up ADD.  Diagnosed with ADD at age 65.  Ritalin as a child; mad pt mean. Then Adderall.  Two every morning and one after lunch.  Landscaping business.  Workers at house at [employer]; return at 5:00pm; then returns calls from 7:00-9:00pm.  No insomnia.  Does better with working.  Glaucoma: maintained on Travoprost.  Miller vision; every six months.   Hypercholesterolemia:  Patient reports good compliance with medication, good tolerance to medication, and good symptom control.      Review of Systems  Constitutional: Negative for fever, chills, diaphoresis, activity change, appetite change and fatigue.  Respiratory: Negative for cough and shortness of breath.   Cardiovascular: Negative for chest pain, palpitations and leg swelling.  Gastrointestinal: Negative for nausea, vomiting, abdominal pain and diarrhea.  Endocrine: Negative for cold intolerance, heat intolerance, polydipsia, polyphagia and polyuria.  Skin: Negative for color change, rash and wound.  Neurological: Negative for dizziness, tremors, seizures, syncope, facial asymmetry, speech difficulty, weakness, light-headedness, numbness and headaches.  Psychiatric/Behavioral: Negative for sleep disturbance and dysphoric mood. The patient is not nervous/anxious.     Past Medical History  Diagnosis Date  . ADHD (attention deficit hyperactivity disorder)   . Glaucoma   . Obesity   . Hyperlipidemia    Past Surgical History  Procedure Laterality Date  . Knee surgery      arthroscopic -  B meniscus repairs   No Known Allergies Current Outpatient Prescriptions  Medication Sig Dispense Refill  . amphetamine-dextroamphetamine (ADDERALL) 20 MG tablet Take 1 tablet (20 mg total) by mouth 3 (three) times daily. 90 tablet 0  . aspirin EC 81 MG tablet Take 405 mg  by mouth once.    Marland Kitchen oxymetazoline (AFRIN) 0.05 % nasal spray Place 2 sprays into the nose at bedtime as needed. For nasal congestion    . pravastatin (PRAVACHOL) 40 MG tablet Take 1 tablet (40 mg total) by mouth daily. 90 tablet 3  . Travoprost, BAK Free, (TRAVATAN) 0.004 % SOLN ophthalmic solution Place 1 drop into both eyes at bedtime.    . diclofenac (VOLTAREN) 75 MG EC tablet Take 1 tablet (75 mg total) by mouth 2 (two) times daily. 30 tablet 0  . HYDROcodone-acetaminophen (NORCO) 5-325 MG tablet Take 1 tablet by mouth every 6 (six) hours as needed. 15 tablet 0   No current facility-administered medications for this visit.   Social History   Social History  . Marital Status: Single    Spouse Name: N/A  . Number of Children: 0  . Years of Education: N/A   Occupational History  . LANDSCAPING    Social History Main Topics  . Smoking status: Former Smoker    Quit date: 05/17/1994  . Smokeless tobacco: Not on file  . Alcohol Use: No     Comment: "Stopped 2 weeks ago" stated on 07/28/2014  . Drug Use: 7.00 per week    Special: Marijuana  . Sexual Activity: Yes    Birth Control/ Protection: Abstinence   Other Topics Concern  . Not on file   Social History Narrative   Marital status: married in 2017; together x 3 years.      Children: none      Lives: with wife      Employment: Biomedical scientist; owns business.  Tobacco:  None       Alcohol: randomly; twice monthly      Drugs: marijuana      Exercise:  Physically demanding          Family History  Problem Relation Age of Onset  . Seizures Father   . Epilepsy Father   . Arthritis Father     hip OA s/p hip replacement  . Sleep apnea Brother        Objective:    BP 120/88 mmHg  Pulse 96  Temp(Src) 98.8 F (37.1 C) (Oral)  Resp 16  Ht 5' 10.5" (1.791 m)  Wt 320 lb (145.151 kg)  BMI 45.25 kg/m2  SpO2 98% Physical Exam  Constitutional: He is oriented to person, place, and time. He appears well-developed and  well-nourished. No distress.  HENT:  Head: Normocephalic and atraumatic.  Right Ear: External ear normal.  Left Ear: External ear normal.  Nose: Nose normal.  Mouth/Throat: Oropharynx is clear and moist.  Eyes: Conjunctivae and EOM are normal. Pupils are equal, round, and reactive to light.  Neck: Normal range of motion. Neck supple. Carotid bruit is not present. No thyromegaly present.  Cardiovascular: Normal rate, regular rhythm, normal heart sounds and intact distal pulses.  Exam reveals no gallop and no friction rub.   No murmur heard. Pulmonary/Chest: Effort normal and breath sounds normal. He has no wheezes. He has no rales.  Abdominal: Soft. Bowel sounds are normal. He exhibits no distension and no mass. There is no tenderness. There is no rebound and no guarding.  Lymphadenopathy:    He has no cervical adenopathy.  Neurological: He is alert and oriented to person, place, and time. No cranial nerve deficit.  Skin: Skin is warm and dry. No rash noted. He is not diaphoretic.  Psychiatric: He has a normal mood and affect. His behavior is normal.  Nursing note and vitals reviewed.       Assessment & Plan:   1. Pure hypercholesterolemia   2. Screening for diabetes mellitus   3. ADD (attention deficit disorder)   4. Infertility male     Orders Placed This Encounter  Procedures  . CBC with Differential/Platelet  . Comprehensive metabolic panel    Order Specific Question:  Has the patient fasted?    Answer:  Yes  . Hemoglobin A1c  . Lipid panel    Order Specific Question:  Has the patient fasted?    Answer:  Yes  . TSH  . Ambulatory referral to Urology    Referral Priority:  Routine    Referral Type:  Consultation    Referral Reason:  Specialty Services Required    Requested Specialty:  Urology    Number of Visits Requested:  1   Meds ordered this encounter  Medications  . DISCONTD: amphetamine-dextroamphetamine (ADDERALL) 20 MG tablet    Sig: Take 1 tablet (20 mg  total) by mouth 3 (three) times daily. Ok to fill 30 days after rx date    Dispense:  90 tablet    Refill:  0  . DISCONTD: amphetamine-dextroamphetamine (ADDERALL) 20 MG tablet    Sig: Take 1 tablet (20 mg total) by mouth 3 (three) times daily. Ok to fill 60 days after rx date    Dispense:  90 tablet    Refill:  0  . amphetamine-dextroamphetamine (ADDERALL) 20 MG tablet    Sig: Take 1 tablet (20 mg total) by mouth 3 (three) times daily.    Dispense:  90 tablet  Refill:  0    Return in about 6 months (around 07/14/2016) for recheck.    Kristi Elayne Guerin, M.D. [facility] [address] [place], [place]  [address] [phone] phone [phone] fax

## 2016-01-16 LAB — CBC WITH DIFFERENTIAL/PLATELET
Basophils Absolute: 0 10*3/uL (ref 0.0–0.1)
Basophils Relative: 0 % (ref 0–1)
Eosinophils Absolute: 0.2 10*3/uL (ref 0.0–0.7)
Eosinophils Relative: 2 % (ref 0–5)
HEMATOCRIT: 42.5 % (ref 39.0–52.0)
Hemoglobin: 14.9 g/dL (ref 13.0–17.0)
Lymphocytes Relative: 29 % (ref 12–46)
Lymphs Abs: 2.4 10*3/uL (ref 0.7–4.0)
MCH: 29.7 pg (ref 26.0–34.0)
MCHC: 35.1 g/dL (ref 30.0–36.0)
MCV: 84.7 fL (ref 78.0–100.0)
MONO ABS: 0.4 10*3/uL (ref 0.1–1.0)
MONOS PCT: 5 % (ref 3–12)
MPV: 10.9 fL (ref 8.6–12.4)
NEUTROS ABS: 5.2 10*3/uL (ref 1.7–7.7)
Neutrophils Relative %: 64 % (ref 43–77)
Platelets: 257 10*3/uL (ref 150–400)
RBC: 5.02 MIL/uL (ref 4.22–5.81)
RDW: 12.9 % (ref 11.5–15.5)
WBC: 8.2 10*3/uL (ref 4.0–10.5)

## 2016-01-16 LAB — LIPID PANEL
CHOL/HDL RATIO: 3 ratio (ref <=5.0)
CHOLESTEROL: 182 mg/dL (ref 125–200)
HDL: 60 mg/dL (ref >=40)
LDL Cholesterol: 100 mg/dL (ref <130)
Triglycerides: 109 mg/dL (ref <150)
VLDL: 22 mg/dL (ref <30)

## 2016-01-16 LAB — COMPREHENSIVE METABOLIC PANEL
ALT: 24 U/L (ref 9–46)
AST: 20 U/L (ref 10–40)
Albumin: 4.3 g/dL (ref 3.6–5.1)
Alkaline Phosphatase: 81 U/L (ref 40–115)
BILIRUBIN TOTAL: 0.4 mg/dL (ref 0.2–1.2)
BUN: 19 mg/dL (ref 7–25)
CALCIUM: 9.4 mg/dL (ref 8.6–10.3)
CO2: 25 mmol/L (ref 20–31)
Chloride: 105 mmol/L (ref 98–110)
Creat: 1.15 mg/dL (ref 0.60–1.35)
Glucose, Bld: 79 mg/dL (ref 65–99)
Potassium: 4.1 mmol/L (ref 3.5–5.3)
Sodium: 139 mmol/L (ref 135–146)
Total Protein: 7 g/dL (ref 6.1–8.1)

## 2016-01-16 LAB — HEMOGLOBIN A1C
HEMOGLOBIN A1C: 5.5 % (ref <5.7)
MEAN PLASMA GLUCOSE: 111 mg/dL (ref <117)

## 2016-01-16 LAB — TSH: TSH: 3.33 mIU/L (ref 0.40–4.50)

## 2016-01-21 ENCOUNTER — Ambulatory Visit (INDEPENDENT_AMBULATORY_CARE_PROVIDER_SITE_OTHER): Payer: BLUE CROSS/BLUE SHIELD

## 2016-01-21 ENCOUNTER — Ambulatory Visit (INDEPENDENT_AMBULATORY_CARE_PROVIDER_SITE_OTHER): Payer: BLUE CROSS/BLUE SHIELD | Admitting: Family Medicine

## 2016-01-21 VITALS — BP 120/80 | HR 65 | Temp 98.0°F | Resp 17 | Ht 72.0 in | Wt 321.0 lb

## 2016-01-21 DIAGNOSIS — M25522 Pain in left elbow: Secondary | ICD-10-CM | POA: Diagnosis not present

## 2016-01-21 MED ORDER — DICLOFENAC SODIUM 75 MG PO TBEC: 75.0000 mg | DELAYED_RELEASE_TABLET | Freq: Two times a day (BID) | ORAL | Status: DC

## 2016-01-21 MED ORDER — HYDROCODONE-ACETAMINOPHEN 5-325 MG PO TABS: 1.0000 | ORAL_TABLET | Freq: Four times a day (QID) | ORAL | Status: DC | PRN

## 2016-01-21 NOTE — Patient Instructions (Addendum)
Referral is being made to orthopedics.  Wear sling, but take arm out of it and do some gentle stretching several times daily  Apply ice for 15 or 20 minutes 5-6 times daily for the next few days  If worse at anytime before you have gotten into the orthopedic office please return  I will be watching for the radiology reading on the elbow.  Because you received an x-ray today, you will receive an invoice from [facility]. Please contact [facility] at [phone] with questions or concerns regarding your invoice. Our billing staff will not be able to assist you with those questions.

## 2016-01-21 NOTE — Progress Notes (Signed)
Patient ID: Craig Coco., male    DOB: 1971/06/18  Age: 45 y.o. MRN: [id number]  Chief Complaint  Patient presents with  . Elbow Injury    left side     Subjective:   45 year old man who got some gym equipment and started working out a couple of weeks ago. About 3 days ago something changes had acute pain in his left elbow. No specific injury. He's been feeling a popping in there that sometimes seems to get some relief transiently. 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 He is married.  Current allergies, medications, problem list, past/family and social histories reviewed.  Objective:  BP 120/80 mmHg  Pulse 65  Temp(Src) 98 F (36.7 C) (Oral)  Resp 17  Ht 6' (1.829 m)  Wt 321 lb (145.605 kg)  BMI 43.53 kg/m2  SpO2 98%  Left elbow is obviously painful. He cannot fully extend the left arm, keeping it at about a 15 angle and extension. Hand is normal. He is tender on the medial and lateral epicondylar regions, more laterally. He seems to be a little bit swollen over the lateral aspect. He has very large arms. No evidence of externally visible trauma.  Assessment & Plan:   Assessment: 1. Left elbow pain       Plan: We'll get an x-ray  There is a tiny piece of calcification adjacent to a little cystic area. I cannot be certain what that represents and will wait for the radiologist reading.  Will place his left arm in a sling.  Orders Placed This Encounter  Procedures  . DG Elbow Complete Left    Order Specific Question:  Reason for Exam (SYMPTOM  OR DIAGNOSIS REQUIRED)    Answer:  left elbow pain.  feels a pop.    Order Specific Question:  Preferred imaging location?    Answer:  External  . Ambulatory referral to Orthopedic Surgery    Referral Priority:  Routine    Referral Type:  Surgical    Referral Reason:  Specialty Services Required    Requested Specialty:  Orthopedic Surgery    Number of Visits  Requested:  1    Meds ordered this encounter  Medications  . diclofenac (VOLTAREN) 75 MG EC tablet    Sig: Take 1 tablet (75 mg total) by mouth 2 (two) times daily.    Dispense:  30 tablet    Refill:  0  . HYDROcodone-acetaminophen (NORCO) 5-325 MG tablet    Sig: Take 1 tablet by mouth every 6 (six) hours as needed.    Dispense:  15 tablet    Refill:  0         Patient Instructions  Referral is being made to orthopedics.  Wear sling, but take arm out of it and do some gentle stretching several times daily  Apply ice for 15 or 20 minutes 5-6 times daily for the next few days  If worse at anytime before you have gotten into the orthopedic office please return  I will be watching for the radiology reading on the elbow.  Because you received an x-ray today, you will receive an invoice from [facility]. Please contact [facility] at [phone] with questions or concerns regarding your invoice. Our billing staff will not be able to assist you with those questions.      Return if symptoms worsen or fail to improve.   Cheyeanne Roadcap, MD 01/21/2016

## 2016-01-31 ENCOUNTER — Encounter: Payer: Self-pay | Admitting: Family Medicine

## 2016-03-11 DIAGNOSIS — H401131 Primary open-angle glaucoma, bilateral, mild stage: Secondary | ICD-10-CM | POA: Diagnosis not present

## 2016-03-11 DIAGNOSIS — H524 Presbyopia: Secondary | ICD-10-CM | POA: Diagnosis not present

## 2016-04-19 ENCOUNTER — Other Ambulatory Visit: Payer: Self-pay

## 2016-04-19 DIAGNOSIS — F988 Other specified behavioral and emotional disorders with onset usually occurring in childhood and adolescence: Secondary | ICD-10-CM

## 2016-04-19 NOTE — Telephone Encounter (Signed)
REFILL REQUEST   amphetamine-dextroamphetamine (ADDERALL) 20 MG tablet   [phone] (H)

## 2016-04-21 ENCOUNTER — Telehealth: Payer: Self-pay

## 2016-04-21 NOTE — Telephone Encounter (Signed)
Pt needs a refill on his adderall rx   Best number [phone]

## 2016-04-23 NOTE — Telephone Encounter (Signed)
Duplicate message. Refill req was already sent to Dr Tamala Julian.

## 2016-04-24 MED ORDER — AMPHETAMINE-DEXTROAMPHETAMINE 20 MG PO TABS: 20.0000 mg | ORAL_TABLET | Freq: Three times a day (TID) | ORAL | Status: DC

## 2016-04-24 NOTE — Telephone Encounter (Signed)
Patient is calling to follow up on refill request. He states that it's been a while and it's supposed to have been refilled already. Please call when ready! [phone]

## 2016-04-24 NOTE — Telephone Encounter (Signed)
Call --- Adderall rx x 3 ready for pick up.  Please advise patient.

## 2016-04-25 NOTE — Telephone Encounter (Signed)
Pt's wife has p/up Rx.

## 2016-06-23 ENCOUNTER — Telehealth: Payer: Self-pay

## 2016-06-23 DIAGNOSIS — F988 Other specified behavioral and emotional disorders with onset usually occurring in childhood and adolescence: Secondary | ICD-10-CM

## 2016-06-23 NOTE — Telephone Encounter (Signed)
Patient needs his amphetamine-dextroamphetamine (ADDERALL) 20 MG tablet refilled.  His call back number is [phone]

## 2016-06-25 NOTE — Telephone Encounter (Signed)
Tried to call pt and the VM did not have the pt's name on recording, only a business name. Therefore I did not leave a detailed message, only asked him to call us back. Please give him Dr Thompson Caul message if he calls back.

## 2016-06-25 NOTE — Telephone Encounter (Signed)
I provided three months of rx on 04/24/16 for patient.  He should not need a new rx until 07/25/16.  He is also due for six month follow-up this month.  He will need to be seen prior to further refills.  Refill denied.

## 2016-07-01 ENCOUNTER — Telehealth: Payer: Self-pay

## 2016-07-01 NOTE — Telephone Encounter (Signed)
Pt has not called back. I tried to call again, and LMOM again to CB.

## 2016-07-01 NOTE — Telephone Encounter (Signed)
PATIENT WOULD LIKE BARBARA TO KNOW THAT HE FOUND HIS ADDERALL 20 MG PRESCRIPTION. NO NEED TO CALL HIM BACK UNLESS SHE HAS QUESTIONS.  BEST PHONE [phone] (CELL)  [place]

## 2016-07-03 NOTE — Telephone Encounter (Signed)
Noted, see notes under previous refill enc.

## 2016-07-03 NOTE — Telephone Encounter (Signed)
Pt called back on 15th and reported that he had found his adderall Rx.

## 2016-07-24 ENCOUNTER — Telehealth: Payer: Self-pay

## 2016-07-24 DIAGNOSIS — F988 Other specified behavioral and emotional disorders with onset usually occurring in childhood and adolescence: Secondary | ICD-10-CM

## 2016-07-24 NOTE — Telephone Encounter (Signed)
Patient request a refill of Adderall 20 MG. [phone]

## 2016-07-25 NOTE — Telephone Encounter (Signed)
Patient's wife called to follow up on refill. She states that he is completely out and cannot function without it.

## 2016-07-26 MED ORDER — AMPHETAMINE-DEXTROAMPHETAMINE 20 MG PO TABS
20.0000 mg | ORAL_TABLET | Freq: Three times a day (TID) | ORAL | 0 refills | Status: DC
Start: 2016-07-26 — End: 2016-08-29

## 2016-07-26 NOTE — Telephone Encounter (Signed)
Patient advised that he needs to see Dr. Tamala Julian before this supply runs out.

## 2016-08-26 ENCOUNTER — Telehealth: Payer: Self-pay

## 2016-08-26 NOTE — Telephone Encounter (Signed)
REFILL ADDERALL   [phone]

## 2016-08-27 NOTE — Telephone Encounter (Signed)
Notes on last RF in Sept state that pt was told he needs to come in to see Dr Tamala Julian for more RFs. Called pt to advise and transferred for scheduling appt.

## 2016-08-29 ENCOUNTER — Ambulatory Visit (INDEPENDENT_AMBULATORY_CARE_PROVIDER_SITE_OTHER): Payer: BLUE CROSS/BLUE SHIELD | Admitting: Family Medicine

## 2016-08-29 VITALS — BP 128/80 | HR 77 | Temp 98.7°F | Resp 16 | Ht 71.0 in | Wt 319.0 lb

## 2016-08-29 DIAGNOSIS — IMO0001 Reserved for inherently not codable concepts without codable children: Secondary | ICD-10-CM

## 2016-08-29 DIAGNOSIS — E7800 Pure hypercholesterolemia, unspecified: Secondary | ICD-10-CM

## 2016-08-29 DIAGNOSIS — J301 Allergic rhinitis due to pollen: Secondary | ICD-10-CM

## 2016-08-29 DIAGNOSIS — Z23 Encounter for immunization: Secondary | ICD-10-CM | POA: Diagnosis not present

## 2016-08-29 DIAGNOSIS — Z6841 Body Mass Index (BMI) 40.0 and over, adult: Secondary | ICD-10-CM

## 2016-08-29 DIAGNOSIS — F909 Attention-deficit hyperactivity disorder, unspecified type: Secondary | ICD-10-CM

## 2016-08-29 DIAGNOSIS — F988 Other specified behavioral and emotional disorders with onset usually occurring in childhood and adolescence: Secondary | ICD-10-CM

## 2016-08-29 DIAGNOSIS — E6609 Other obesity due to excess calories: Secondary | ICD-10-CM

## 2016-08-29 LAB — COMPREHENSIVE METABOLIC PANEL
ALBUMIN: 4.2 g/dL (ref 3.6–5.1)
ALT: 26 U/L (ref 9–46)
AST: 20 U/L (ref 10–40)
Alkaline Phosphatase: 83 U/L (ref 40–115)
BUN: 17 mg/dL (ref 7–25)
CALCIUM: 9.6 mg/dL (ref 8.6–10.3)
CHLORIDE: 103 mmol/L (ref 98–110)
CO2: 28 mmol/L (ref 20–31)
Creat: 1 mg/dL (ref 0.60–1.35)
Glucose, Bld: 90 mg/dL (ref 65–99)
POTASSIUM: 4.6 mmol/L (ref 3.5–5.3)
Sodium: 138 mmol/L (ref 135–146)
Total Bilirubin: 0.5 mg/dL (ref 0.2–1.2)
Total Protein: 6.9 g/dL (ref 6.1–8.1)

## 2016-08-29 LAB — LIPID PANEL
Cholesterol: 189 mg/dL (ref 125–200)
HDL: 61 mg/dL (ref >=40)
LDL CALC: 110 mg/dL (ref <130)
TRIGLYCERIDES: 90 mg/dL (ref <150)
Total CHOL/HDL Ratio: 3.1 Ratio (ref <=5.0)
VLDL: 18 mg/dL (ref <30)

## 2016-08-29 MED ORDER — AMPHETAMINE-DEXTROAMPHETAMINE 20 MG PO TABS: 20.0000 mg | ORAL_TABLET | Freq: Three times a day (TID) | ORAL | 0 refills | Status: DC

## 2016-08-29 MED ORDER — PRAVASTATIN SODIUM 40 MG PO TABS: 40.0000 mg | ORAL_TABLET | Freq: Every day | ORAL | 3 refills | Status: DC

## 2016-08-29 NOTE — Progress Notes (Signed)
Subjective:    Patient ID: Craig Coco., male    DOB: 02-02-1971, 45 y.o.   MRN: [id number]  08/29/2016  Medication Refill (adderall)   HPI This 45 y.o. male presents for six month follow-up for ADHD, hypercholesterolemia, obesity. Doing well.  Concentration is good with current dose of Adderall. Takes two every morning and one every afternoon; usually only takes two tablets on Sundays/day off.  No side effects to Adderall.  Work performance is good. [place].    Hypercholesterolemia: Patient reports good compliance with medication, good tolerance to medication, and good symptom control.    Snoring/obesity: s/p sleep study two years ago.  No evidence of sleep apnea.  Allergic rhinitis: using Afrin qhs twice weekly during allergy season. Has tried Claritin daily for one month; no improvement in nasal congestion and cough.  Has used Flonase for one month without improvement in symptoms.   L eye irritation: last week had L eye redness, irritation, photophobia. Wears contacts; has removed contact for the past week with resolution of symptoms. Eye no longer red. No blurred vision other than related to using old glass rx.   BP Readings from Last 3 Encounters:  08/29/16 128/80  01/21/16 120/80  01/15/16 120/88    Wt Readings from Last 3 Encounters:  08/29/16 (!) 319 lb (144.7 kg)  01/21/16 (!) 321 lb (145.6 kg)  01/15/16 (!) 320 lb (145.2 kg)   Review of Systems  Constitutional: Negative for activity change, appetite change, chills, diaphoresis, fatigue and fever.  HENT: Positive for congestion, postnasal drip and voice change. Negative for ear pain, rhinorrhea, sinus pressure, sore throat and trouble swallowing.   Eyes: Positive for redness. Negative for photophobia, pain, discharge, itching and visual disturbance.  Respiratory: Positive for cough. Negative for shortness of breath.   Cardiovascular: Negative for chest pain, palpitations and leg swelling.    Gastrointestinal: Negative for abdominal pain, diarrhea, nausea and vomiting.  Endocrine: Negative for cold intolerance, heat intolerance, polydipsia, polyphagia and polyuria.  Skin: Negative for color change, rash and wound.  Neurological: Negative for dizziness, tremors, seizures, syncope, facial asymmetry, speech difficulty, weakness, light-headedness, numbness and headaches.  Psychiatric/Behavioral: Positive for decreased concentration. Negative for dysphoric mood and sleep disturbance. The patient is not nervous/anxious.     Past Medical History:  Diagnosis Date  . ADHD (attention deficit hyperactivity disorder)   . Allergy   . Glaucoma   . Hyperlipidemia   . Obesity    Past Surgical History:  Procedure Laterality Date  . KNEE SURGERY     arthroscopic -  B meniscus repairs   No Known Allergies Current Outpatient Prescriptions  Medication Sig Dispense Refill  . amphetamine-dextroamphetamine (ADDERALL) 20 MG tablet Take 1 tablet (20 mg total) by mouth 3 (three) times daily. 90 tablet 0  . aspirin EC 81 MG tablet Take 405 mg by mouth once.    Marland Kitchen oxymetazoline (AFRIN) 0.05 % nasal spray Place 2 sprays into the nose at bedtime as needed. For nasal congestion    . Travoprost, BAK Free, (TRAVATAN) 0.004 % SOLN ophthalmic solution Place 1 drop into both eyes at bedtime.    . pravastatin (PRAVACHOL) 40 MG tablet Take 1 tablet (40 mg total) by mouth daily. 90 tablet 3   No current facility-administered medications for this visit.    Social History   Social History  . Marital status: Single    Spouse name: N/A  . Number of children: 0  . Years of education: N/A  Occupational History  . [employer]    Social History Main Topics  . Smoking status: Former Smoker    Quit date: 05/17/1994  . Smokeless tobacco: Never Used  . Alcohol use No     Comment: "Stopped 2 weeks ago" stated on 07/28/2014  . Drug use:     Frequency: 7.0 times per week    Types: Marijuana  . Sexual  activity: Yes    Birth control/ protection: Abstinence   Other Topics Concern  . Not on file   Social History Narrative   Marital status: married in 2017; together x 3 years.      Children: none      Lives: with wife      Employment: Biomedical scientist; owns business.      Tobacco:  None       Alcohol: randomly; twice monthly      Drugs: marijuana      Exercise:  Physically demanding          Family History  Problem Relation Age of Onset  . Seizures Father   . Epilepsy Father   . Arthritis Father     hip OA s/p hip replacement  . Sleep apnea Brother        Objective:    BP 128/80 (BP Location: Right Arm, Patient Position: Sitting, Cuff Size: Large)   Pulse 77   Temp 98.7 F (37.1 C) (Oral)   Resp 16   Ht 5' 11" (1.803 m)   Wt (!) 319 lb (144.7 kg)   SpO2 99%   BMI 44.49 kg/m  Physical Exam  Constitutional: He is oriented to person, place, and time. He appears well-developed and well-nourished. No distress.  HENT:  Head: Normocephalic and atraumatic.  Right Ear: External ear normal.  Left Ear: External ear normal.  Nose: Nose normal.  Mouth/Throat: Oropharynx is clear and moist.  Eyes: Conjunctivae and EOM are normal. Pupils are equal, round, and reactive to light. Right eye exhibits no discharge. Left eye exhibits no discharge.  Neck: Normal range of motion. Neck supple. Carotid bruit is not present. No thyromegaly present.  Cardiovascular: Normal rate, regular rhythm, normal heart sounds and intact distal pulses.  Exam reveals no gallop and no friction rub.   No murmur heard. Pulmonary/Chest: Effort normal and breath sounds normal. He has no wheezes. He has no rales.  Abdominal: Soft. Bowel sounds are normal. There is no guarding.  Lymphadenopathy:    He has no cervical adenopathy.  Neurological: He is alert and oriented to person, place, and time. No cranial nerve deficit. He exhibits normal muscle tone. Coordination normal.  Skin: Skin is warm and dry. No rash  noted. He is not diaphoretic.  Psychiatric: He has a normal mood and affect. His behavior is normal. Judgment and thought content normal.  Nursing note and vitals reviewed.  Results for orders placed or performed in visit on 01/15/16  CBC with Differential/Platelet  Result Value Ref Range   WBC 8.2 4.0 - 10.5 K/uL   RBC 5.02 4.22 - 5.81 MIL/uL   Hemoglobin 14.9 13.0 - 17.0 g/dL   HCT 42.5 39.0 - 52.0 %   MCV 84.7 78.0 - 100.0 fL   MCH 29.7 26.0 - 34.0 pg   MCHC 35.1 30.0 - 36.0 g/dL   RDW 12.9 11.5 - 15.5 %   Platelets 257 150 - 400 K/uL   MPV 10.9 8.6 - 12.4 fL   Neutrophils Relative % 64 43 - 77 %   Neutro Abs  5.2 1.7 - 7.7 K/uL   Lymphocytes Relative 29 12 - 46 %   Lymphs Abs 2.4 0.7 - 4.0 K/uL   Monocytes Relative 5 3 - 12 %   Monocytes Absolute 0.4 0.1 - 1.0 K/uL   Eosinophils Relative 2 0 - 5 %   Eosinophils Absolute 0.2 0.0 - 0.7 K/uL   Basophils Relative 0 0 - 1 %   Basophils Absolute 0.0 0.0 - 0.1 K/uL   Smear Review Criteria for review not met   Comprehensive metabolic panel  Result Value Ref Range   Sodium 139 135 - 146 mmol/L   Potassium 4.1 3.5 - 5.3 mmol/L   Chloride 105 98 - 110 mmol/L   CO2 25 20 - 31 mmol/L   Glucose, Bld 79 65 - 99 mg/dL   BUN 19 7 - 25 mg/dL   Creat 1.15 0.60 - 1.35 mg/dL   Total Bilirubin 0.4 0.2 - 1.2 mg/dL   Alkaline Phosphatase 81 40 - 115 U/L   AST 20 10 - 40 U/L   ALT 24 9 - 46 U/L   Total Protein 7.0 6.1 - 8.1 g/dL   Albumin 4.3 3.6 - 5.1 g/dL   Calcium 9.4 8.6 - 10.3 mg/dL  Hemoglobin A1c  Result Value Ref Range   Hgb A1c MFr Bld 5.5 <5.7 %   Mean Plasma Glucose 111 <117 mg/dL  Lipid panel  Result Value Ref Range   Cholesterol 182 125 - 200 mg/dL   Triglycerides 109 <150 mg/dL   HDL 60 >=40 mg/dL   Total CHOL/HDL Ratio 3.0 <=5.0 Ratio   VLDL 22 <30 mg/dL   LDL Cholesterol 100 <130 mg/dL  TSH  Result Value Ref Range   TSH 3.33 0.40 - 4.50 mIU/L       Assessment & Plan:   1. Attention deficit disorder (ADD)  without hyperactivity   2. Pure hypercholesterolemia   3. Acute seasonal allergic rhinitis due to pollen   4. Class 3 obesity due to excess calories with serious comorbidity and body mass index (BMI) of 40.0 to 44.9 in adult ([place])   5. Need for prophylactic vaccination and inoculation against influenza    -controlled. Refill of Adderall x 3 months provided; RTC in 6 months; call in 3 months for refills. -uncontrolled allergic rhinitis; pt has failed trial of Flonase and Claritin; resistant to trial of other agents; continue Afrin sparingly. -obtain labs; refill of Pravastatin provided. -s/p flu vaccine. -recommend weight loss, exercise.   Orders Placed This Encounter  Procedures  . Flu Vaccine QUAD 36+ mos IM  . Comprehensive metabolic panel    Order Specific Question:   Has the patient fasted?    Answer:   Yes  . Lipid panel    Order Specific Question:   Has the patient fasted?    Answer:   Yes   Meds ordered this encounter  Medications  . DISCONTD: amphetamine-dextroamphetamine (ADDERALL) 20 MG tablet    Sig: Take 1 tablet (20 mg total) by mouth 3 (three) times daily.    Dispense:  90 tablet    Refill:  0  . DISCONTD: amphetamine-dextroamphetamine (ADDERALL) 20 MG tablet    Sig: Take 1 tablet (20 mg total) by mouth 3 (three) times daily.    Dispense:  90 tablet    Refill:  0    Fill 30 days after date.  Marland Kitchen amphetamine-dextroamphetamine (ADDERALL) 20 MG tablet    Sig: Take 1 tablet (20 mg total) by mouth 3 (three) times  daily.    Dispense:  90 tablet    Refill:  0    Fill 60 days after date.  . pravastatin (PRAVACHOL) 40 MG tablet    Sig: Take 1 tablet (40 mg total) by mouth daily.    Dispense:  90 tablet    Refill:  3    Return in about 6 months (around 02/27/2017) for complete physical examiniation.   Ashwini Jago Elayne Guerin, M.D. [facility] [address] [place], [place]  [address] [phone] phone [phone] fax

## 2016-08-29 NOTE — Patient Instructions (Addendum)
IF you received an x-ray today, you will receive an invoice from [facility]. Please contact [facility] at [phone] with questions or concerns regarding your invoice.   IF you received labwork today, you will receive an invoice from [employer]. Please contact [facility] at [phone] with questions or concerns regarding your invoice.   Our billing staff will not be able to assist you with questions regarding bills from these companies.  You will be contacted with the lab results as soon as they are available. The fastest way to get your results is to activate your My Chart account. Instructions are located on the last page of this paperwork. If you have not heard from [place] regarding the results in 2 weeks, please contact this office.     Influenza (Flu) Vaccine (Inactivated or Recombinant):  1. Why get vaccinated? Influenza ("flu") is a contagious disease that spreads around the [place] every year, usually between October and May. Flu is caused by influenza viruses, and is spread mainly by coughing, sneezing, and close contact. Anyone can get flu. Flu strikes suddenly and can last several days. Symptoms vary by age, but can include:  fever/chills  sore throat  muscle aches  fatigue  cough  headache  runny or stuffy nose Flu can also lead to pneumonia and blood infections, and cause diarrhea and seizures in children. If you have a medical condition, such as heart or lung disease, flu can make it worse. Flu is more dangerous for some people. Infants and young children, people 7 years of age and older, pregnant women, and people with certain health conditions or a weakened immune system are at greatest risk. Each year thousands of people in the [place] die from flu, and many more are hospitalized. Flu vaccine can:  keep you from getting flu,  make flu less severe if you do get it, and  keep you from spreading flu  to your family and other people. 2. Inactivated and recombinant flu vaccines A dose of flu vaccine is recommended every flu season. Children 6 months through 65 years of age may need two doses during the same flu season. Everyone else needs only one dose each flu season. Some inactivated flu vaccines contain a very small amount of a mercury-based preservative called thimerosal. Studies have not shown thimerosal in vaccines to be harmful, but flu vaccines that do not contain thimerosal are available. There is no live flu virus in flu shots. They cannot cause the flu. There are many flu viruses, and they are always changing. Each year a new flu vaccine is made to protect against three or four viruses that are likely to cause disease in the upcoming flu season. But even when the vaccine doesn't exactly match these viruses, it may still provide some protection. Flu vaccine cannot prevent:  flu that is caused by a virus not covered by the vaccine, or  illnesses that look like flu but are not. It takes about 2 weeks for protection to develop after vaccination, and protection lasts through the flu season. 3. Some people should not get this vaccine Tell the person who is giving you the vaccine:  If you have any severe, life-threatening allergies. If you ever had a life-threatening allergic reaction after a dose of flu vaccine, or have a severe allergy to any part of this vaccine, you may be advised not to get vaccinated. Most, but not all, types of flu vaccine contain a small amount of egg  protein.  If you ever had Guillain-Barre Syndrome (also called GBS). Some people with a history of GBS should not get this vaccine. This should be discussed with your doctor.  If you are not feeling well. It is usually okay to get flu vaccine when you have a mild illness, but you might be asked to come back when you feel better. 4. Risks of a vaccine reaction With any medicine, including vaccines, there is a chance of  reactions. These are usually mild and go away on their own, but serious reactions are also possible. Most people who get a flu shot do not have any problems with it. Minor problems following a flu shot include:  soreness, redness, or swelling where the shot was given  hoarseness  sore, red or itchy eyes  cough  fever  aches  headache  itching  fatigue If these problems occur, they usually begin soon after the shot and last 1 or 2 days. More serious problems following a flu shot can include the following:  There may be a small increased risk of Guillain-Barre Syndrome (GBS) after inactivated flu vaccine. This risk has been estimated at 1 or 2 additional cases per million people vaccinated. This is much lower than the risk of severe complications from flu, which can be prevented by flu vaccine.  Young children who get the flu shot along with pneumococcal vaccine (PCV13) and/or DTaP vaccine at the same time might be slightly more likely to have a seizure caused by fever. Ask your doctor for more information. Tell your doctor if a child who is getting flu vaccine has ever had a seizure. Problems that could happen after any injected vaccine:  People sometimes faint after a medical procedure, including vaccination. Sitting or lying down for about 15 minutes can help prevent fainting, and injuries caused by a fall. Tell your doctor if you feel dizzy, or have vision changes or ringing in the ears.  Some people get severe pain in the shoulder and have difficulty moving the arm where a shot was given. This happens very rarely.  Any medication can cause a severe allergic reaction. Such reactions from a vaccine are very rare, estimated at about 1 in a million doses, and would happen within a few minutes to a few hours after the vaccination. As with any medicine, there is a very remote chance of a vaccine causing a serious injury or death. The safety of vaccines is always being monitored. For  more information, visit: http://www.aguilar.org/ 5. What if there is a serious reaction? What should I look for?  Look for anything that concerns you, such as signs of a severe allergic reaction, very high fever, or unusual behavior. Signs of a severe allergic reaction can include hives, swelling of the face and throat, difficulty breathing, a fast heartbeat, dizziness, and weakness. These would start a few minutes to a few hours after the vaccination. What should I do?  If you think it is a severe allergic reaction or other emergency that can't wait, call 9-1-1 and get the person to the nearest hospital. Otherwise, call your doctor.  Reactions should be reported to the Vaccine Adverse Event Reporting System (VAERS). Your doctor should file this report, or you can do it yourself through the VAERS web site at www.vaers.SamedayNews.es, or by calling 623-646-9318. VAERS does not give medical advice. 6. The National Vaccine Injury Compensation Program The National Vaccine Injury Compensation Program (VICP) is a federal program that was created to compensate people who may  have been injured by certain vaccines. Persons who believe they may have been injured by a vaccine can learn about the program and about filing a claim by calling (505) 765-2497 or visiting the [place] website at GoldCloset.com.ee. There is a time limit to file a claim for compensation. 7. How can I learn more?  Ask your healthcare provider. He or she can give you the vaccine package insert or suggest other sources of information.  Call your local or state health department.  Contact the Centers for Disease Control and Prevention (CDC):  Call 518-477-8330 (1-800-CDC-INFO) or  Visit CDC's website at https://gibson.com/ Vaccine Information Statement Inactivated Influenza Vaccine (06/23/2014)   This information is not intended to replace advice given to you by your health care provider. Make sure you discuss any  questions you have with your health care provider.   Document Released: 08/28/2006 Document Revised: 11/24/2014 Document Reviewed: 06/26/2014 Elsevier Interactive Patient Education 2016 Elsevier Inc. Influenza (Flu) Vaccine (Inactivated or Recombinant):  1. Why get vaccinated? Influenza ("flu") is a contagious disease that spreads around the [place] every year, usually between October and May. Flu is caused by influenza viruses, and is spread mainly by coughing, sneezing, and close contact. Anyone can get flu. Flu strikes suddenly and can last several days. Symptoms vary by age, but can include:  fever/chills  sore throat  muscle aches  fatigue  cough  headache  runny or stuffy nose Flu can also lead to pneumonia and blood infections, and cause diarrhea and seizures in children. If you have a medical condition, such as heart or lung disease, flu can make it worse. Flu is more dangerous for some people. Infants and young children, people 30 years of age and older, pregnant women, and people with certain health conditions or a weakened immune system are at greatest risk. Each year thousands of people in the [place] die from flu, and many more are hospitalized. Flu vaccine can:  keep you from getting flu,  make flu less severe if you do get it, and  keep you from spreading flu to your family and other people. 2. Inactivated and recombinant flu vaccines A dose of flu vaccine is recommended every flu season. Children 6 months through 35 years of age may need two doses during the same flu season. Everyone else needs only one dose each flu season. Some inactivated flu vaccines contain a very small amount of a mercury-based preservative called thimerosal. Studies have not shown thimerosal in vaccines to be harmful, but flu vaccines that do not contain thimerosal are available. There is no live flu virus in flu shots. They cannot cause the flu. There are many flu viruses, and  they are always changing. Each year a new flu vaccine is made to protect against three or four viruses that are likely to cause disease in the upcoming flu season. But even when the vaccine doesn't exactly match these viruses, it may still provide some protection. Flu vaccine cannot prevent:  flu that is caused by a virus not covered by the vaccine, or  illnesses that look like flu but are not. It takes about 2 weeks for protection to develop after vaccination, and protection lasts through the flu season. 3. Some people should not get this vaccine Tell the person who is giving you the vaccine:  If you have any severe, life-threatening allergies. If you ever had a life-threatening allergic reaction after a dose of flu vaccine, or have a severe allergy to any part of this  vaccine, you may be advised not to get vaccinated. Most, but not all, types of flu vaccine contain a small amount of egg protein.  If you ever had Guillain-Barre Syndrome (also called GBS). Some people with a history of GBS should not get this vaccine. This should be discussed with your doctor.  If you are not feeling well. It is usually okay to get flu vaccine when you have a mild illness, but you might be asked to come back when you feel better. 4. Risks of a vaccine reaction With any medicine, including vaccines, there is a chance of reactions. These are usually mild and go away on their own, but serious reactions are also possible. Most people who get a flu shot do not have any problems with it. Minor problems following a flu shot include:  soreness, redness, or swelling where the shot was given  hoarseness  sore, red or itchy eyes  cough  fever  aches  headache  itching  fatigue If these problems occur, they usually begin soon after the shot and last 1 or 2 days. More serious problems following a flu shot can include the following:  There may be a small increased risk of Guillain-Barre Syndrome (GBS) after  inactivated flu vaccine. This risk has been estimated at 1 or 2 additional cases per million people vaccinated. This is much lower than the risk of severe complications from flu, which can be prevented by flu vaccine.  Young children who get the flu shot along with pneumococcal vaccine (PCV13) and/or DTaP vaccine at the same time might be slightly more likely to have a seizure caused by fever. Ask your doctor for more information. Tell your doctor if a child who is getting flu vaccine has ever had a seizure. Problems that could happen after any injected vaccine:  People sometimes faint after a medical procedure, including vaccination. Sitting or lying down for about 15 minutes can help prevent fainting, and injuries caused by a fall. Tell your doctor if you feel dizzy, or have vision changes or ringing in the ears.  Some people get severe pain in the shoulder and have difficulty moving the arm where a shot was given. This happens very rarely.  Any medication can cause a severe allergic reaction. Such reactions from a vaccine are very rare, estimated at about 1 in a million doses, and would happen within a few minutes to a few hours after the vaccination. As with any medicine, there is a very remote chance of a vaccine causing a serious injury or death. The safety of vaccines is always being monitored. For more information, visit: http://www.aguilar.org/ 5. What if there is a serious reaction? What should I look for?  Look for anything that concerns you, such as signs of a severe allergic reaction, very high fever, or unusual behavior. Signs of a severe allergic reaction can include hives, swelling of the face and throat, difficulty breathing, a fast heartbeat, dizziness, and weakness. These would start a few minutes to a few hours after the vaccination. What should I do?  If you think it is a severe allergic reaction or other emergency that can't wait, call 9-1-1 and get the person to the  nearest hospital. Otherwise, call your doctor.  Reactions should be reported to the Vaccine Adverse Event Reporting System (VAERS). Your doctor should file this report, or you can do it yourself through the VAERS web site at www.vaers.SamedayNews.es, or by calling 541-063-0944. VAERS does not give medical advice. 6. The [employer]  Vaccine Injury Compensation Program The National Vaccine Injury Compensation Program (VICP) is a federal program that was created to compensate people who may have been injured by certain vaccines. Persons who believe they may have been injured by a vaccine can learn about the program and about filing a claim by calling 681-179-0879 or visiting the [place] website at GoldCloset.com.ee. There is a time limit to file a claim for compensation. 7. How can I learn more?  Ask your healthcare provider. He or she can give you the vaccine package insert or suggest other sources of information.  Call your local or state health department.  Contact the Centers for Disease Control and Prevention (CDC):  Call 432-572-8860 (1-800-CDC-INFO) or  Visit CDC's website at https://gibson.com/ Vaccine Information Statement Inactivated Influenza Vaccine (06/23/2014)   This information is not intended to replace advice given to you by your health care provider. Make sure you discuss any questions you have with your health care provider.   Document Released: 08/28/2006 Document Revised: 11/24/2014 Document Reviewed: 06/26/2014 Elsevier Interactive Patient Education [employer].

## 2016-09-24 ENCOUNTER — Other Ambulatory Visit: Payer: Self-pay | Admitting: Family Medicine

## 2016-09-24 DIAGNOSIS — E7800 Pure hypercholesterolemia, unspecified: Secondary | ICD-10-CM

## 2016-11-26 ENCOUNTER — Other Ambulatory Visit: Payer: Self-pay

## 2016-11-26 DIAGNOSIS — F988 Other specified behavioral and emotional disorders with onset usually occurring in childhood and adolescence: Secondary | ICD-10-CM

## 2016-11-26 NOTE — Telephone Encounter (Signed)
PATIENT WOULD LIKE DR. Tamala Julian TO KNOW THAT IT IS TIME TO GET A REFILL ON HIS ADDERALL 20 MG. PLEASE CALL HIM WHEN IT HAS BEEN DONE. BEST PHONE [phone]   [place]

## 2016-11-26 NOTE — Telephone Encounter (Signed)
08/29/16 last ov and rx

## 2016-11-27 NOTE — Telephone Encounter (Signed)
Pt calling back to check on refill of adderall. Informed him that I will put in another message.

## 2016-11-28 ENCOUNTER — Telehealth: Payer: Self-pay | Admitting: Emergency Medicine

## 2016-11-28 NOTE — Telephone Encounter (Signed)
Dr. Tamala Julian, Pt is calling back requesting medication refill; on Adderall medication. States, he will run out today.  Please see last message from Santiago Glad on last refill

## 2016-12-01 MED ORDER — AMPHETAMINE-DEXTROAMPHETAMINE 20 MG PO TABS: 20.0000 mg | ORAL_TABLET | Freq: Three times a day (TID) | ORAL | 0 refills | Status: DC

## 2016-12-01 NOTE — Telephone Encounter (Signed)
Refills approved for Adderall x 3 months.  Santiago Glad, RN provided pt with rx x 3 today.

## 2016-12-04 NOTE — Telephone Encounter (Signed)
Refilled on 12/01/16; provided to patient by RN Santiago Glad.

## 2016-12-05 ENCOUNTER — Encounter: Payer: Self-pay | Admitting: Family Medicine

## 2017-01-04 ENCOUNTER — Other Ambulatory Visit: Payer: Self-pay | Admitting: Family Medicine

## 2017-01-04 DIAGNOSIS — M25522 Pain in left elbow: Secondary | ICD-10-CM

## 2017-01-05 NOTE — Telephone Encounter (Signed)
08/2016 last ov 01/2016 last volteran

## 2017-01-28 ENCOUNTER — Ambulatory Visit: Payer: BLUE CROSS/BLUE SHIELD

## 2017-01-29 ENCOUNTER — Ambulatory Visit (INDEPENDENT_AMBULATORY_CARE_PROVIDER_SITE_OTHER): Payer: BLUE CROSS/BLUE SHIELD | Admitting: Physician Assistant

## 2017-01-29 VITALS — BP 118/80 | HR 77 | Temp 99.6°F | Resp 18 | Ht 71.0 in | Wt 326.4 lb

## 2017-01-29 DIAGNOSIS — R69 Illness, unspecified: Secondary | ICD-10-CM

## 2017-01-29 DIAGNOSIS — J111 Influenza due to unidentified influenza virus with other respiratory manifestations: Secondary | ICD-10-CM

## 2017-01-29 DIAGNOSIS — J101 Influenza due to other identified influenza virus with other respiratory manifestations: Secondary | ICD-10-CM | POA: Diagnosis not present

## 2017-01-29 LAB — POCT INFLUENZA A/B
INFLUENZA A, POC: NEGATIVE
INFLUENZA B, POC: POSITIVE — AB

## 2017-01-29 MED ORDER — NAPROXEN 500 MG PO TABS: 500.0000 mg | ORAL_TABLET | Freq: Two times a day (BID) | ORAL | 0 refills | Status: DC

## 2017-01-29 MED ORDER — HYDROCODONE-HOMATROPINE 5-1.5 MG/5ML PO SYRP: 2.5000 mL | ORAL_SOLUTION | Freq: Two times a day (BID) | ORAL | 0 refills | Status: DC | PRN

## 2017-01-29 MED ORDER — BENZONATATE 200 MG PO CAPS: 200.0000 mg | ORAL_CAPSULE | Freq: Two times a day (BID) | ORAL | 0 refills | Status: DC | PRN

## 2017-01-29 NOTE — Patient Instructions (Addendum)
  Drink lots of fluids,  8oz per hour while awake.   IF you received an x-ray today, you will receive an invoice from [facility]. Please contact [facility] at [phone] with questions or concerns regarding your invoice.   IF you received labwork today, you will receive an invoice from [facility]. Please contact [facility] at [phone] with questions or concerns regarding your invoice.   Our billing staff will not be able to assist you with questions regarding bills from these companies.  You will be contacted with the lab results as soon as they are available. The fastest way to get your results is to activate your My Chart account. Instructions are located on the last page of this paperwork. If you have not heard from [place] regarding the results in 2 weeks, please contact this office.

## 2017-01-29 NOTE — Progress Notes (Signed)
01/29/2017 12:12 PM   DOB: 08/08/1971 / MRN: [id number]  SUBJECTIVE:  Craig Jordan. is a 46 y.o. male presenting for faigue that started 4 days ago.  He then started having cough, nasal and chest congestion.  Has tried mucinex and zicam without improvement. He has also been taking aleve last dosed this morning. He denies a history of pulmonary disease, diabetes, CAD.   He has No Known Allergies.   He  has a past medical history of ADHD (attention deficit hyperactivity disorder); Allergy; Glaucoma; Hyperlipidemia; and Obesity.    He  reports that he quit smoking about 22 years ago. He has never used smokeless tobacco. He reports that he uses drugs, including Marijuana, about 7 times per week. He reports that he does not drink alcohol. He  reports that he currently engages in sexual activity. He reports using the following method of birth control/protection: Abstinence. The patient  has a past surgical history that includes Knee surgery.  His family history includes Arthritis in his father; Epilepsy in his father; Seizures in his father; Sleep apnea in his brother.  Review of Systems  Constitutional: Positive for chills and fever.  HENT: Positive for congestion, sinus pain and sore throat. Negative for hearing loss.   Respiratory: Positive for cough and sputum production. Negative for hemoptysis, shortness of breath and wheezing.   Cardiovascular: Negative for chest pain and leg swelling.  Neurological: Negative for dizziness.    The problem list and medications were reviewed and updated by myself where necessary and exist elsewhere in the encounter.   OBJECTIVE:  BP 118/80 (BP Location: Right Arm, Patient Position: Sitting, Cuff Size: Large)   Pulse 77   Temp 99.6 F (37.6 C) (Oral)   Resp 18   Ht 5' 11"  (1.803 m)   Wt (!) 326 lb 6.4 oz (148.1 kg)   SpO2 97%   PF 600 L/min   BMI 45.52 kg/m   Physical Exam  Constitutional: He is oriented to person, place, and time.   Cardiovascular: Normal rate, regular rhythm and normal heart sounds.  Exam reveals no gallop, no friction rub and no decreased pulses.   No murmur heard. Pulmonary/Chest: Effort normal and breath sounds normal. No respiratory distress. He has no decreased breath sounds. He has no wheezes. He has no rhonchi. He has no rales.  Musculoskeletal: He exhibits no edema.  Neurological: He is alert and oriented to person, place, and time.  Skin: Skin is warm and dry. He is not diaphoretic.    Results for orders placed or performed in visit on 01/29/17 (from the past 72 hour(s))  POCT Influenza A/B     Status: Abnormal   Collection Time: 01/29/17 11:51 AM  Result Value Ref Range   Influenza A, POC Negative Negative   Influenza B, POC Positive (A) Negative    No results found.  ASSESSMENT AND PLAN:  [name] was seen today for influenza, cough, fatigue and nasal congestion.  Diagnoses and all orders for this visit:  Influenza-like illness -     POCT Influenza A/B -     naproxen (NAPROSYN) 500 MG tablet; Take 1 tablet (500 mg total) by mouth 2 (two) times daily with a meal. -     HYDROcodone-homatropine (HYCODAN) 5-1.5 MG/5ML syrup; Take 2.5-5 mLs by mouth 2 (two) times daily as needed (Do not drive while taking this medication). -     benzonatate (TESSALON) 200 MG capsule; Take 1 capsule (200 mg total) by mouth 2 (two) times  daily as needed for cough.  Influenza B: It is too late for tamiflu and he is not immunocompromised and has no pulmonary or known endocrinological disorders. Will treat symptomatically for now.     The patient is advised to call or return to clinic if he does not see an improvement in symptoms, or to seek the care of the closest emergency department if he worsens with the above plan.   Philis Fendt, MHS, PA-C [facility] 01/29/2017 12:12 PM

## 2017-03-05 DIAGNOSIS — H401131 Primary open-angle glaucoma, bilateral, mild stage: Secondary | ICD-10-CM | POA: Diagnosis not present

## 2017-03-06 ENCOUNTER — Telehealth: Payer: Self-pay | Admitting: Family Medicine

## 2017-03-06 DIAGNOSIS — F988 Other specified behavioral and emotional disorders with onset usually occurring in childhood and adolescence: Secondary | ICD-10-CM

## 2017-03-06 NOTE — Telephone Encounter (Signed)
Please review. Philis Fendt, MS, PA-C 3:40 PM, 03/06/2017

## 2017-03-06 NOTE — Telephone Encounter (Signed)
08/2016 last ov 12/01/16 last refill

## 2017-03-06 NOTE — Telephone Encounter (Signed)
Pt states that he is out of meds and request a call back today (03/06/17) to come pick his meds up today.  (also advise pt time frame on refills)

## 2017-03-06 NOTE — Telephone Encounter (Signed)
Pt needs a refill of adderall.  Says it was supposed to be filled on 03-01-17.  I do not see a request for this.  He is completely out.  Please call [phone]

## 2017-03-10 MED ORDER — AMPHETAMINE-DEXTROAMPHETAMINE 20 MG PO TABS: 20.0000 mg | ORAL_TABLET | Freq: Three times a day (TID) | ORAL | 0 refills | Status: DC

## 2017-03-10 NOTE — Telephone Encounter (Signed)
Patient presented to office today and very angry and rude to clerical staff.  Staff also reported that patient using profanity.  I spoke with patient; reviewed his request for refill of Adderall on Friday, 4/20.  I was out of office until today/Tuesday, 03/10/17.  Refill could not be provided by another provider due to schedule II controlled substance. Pt also due for six month follow-up visit for ADHD: pt was advised of this at visit in 08/2016.  Discussed pt's inappropriate behavior with him and advised not acceptable.  One month refill of Adderall provided to patient to enable him to establish with another group.

## 2017-03-11 ENCOUNTER — Ambulatory Visit: Payer: BLUE CROSS/BLUE SHIELD | Admitting: Family Medicine

## 2017-03-13 ENCOUNTER — Telehealth: Payer: Self-pay | Admitting: Family Medicine

## 2017-03-13 NOTE — Telephone Encounter (Signed)
Okay, great! Dr. Lorelei Pont. I will be happy to make pt aware. Would you like for him to have a 30 minute visit with you or is 15 okay, also if 30 is it okay to put 2 slots together?

## 2017-03-13 NOTE — Telephone Encounter (Signed)
Please give him a call-I do know this pt from [facility] and am looking forward to taking care of him again. However, since I have not seen him in 2 years I cannot rx a controlled substance. We can either try to move up his appt or he could see Dr. Reginia Forts in the interim.  [place] with me if I see him sooner, he does not really need a new pat appt.

## 2017-03-13 NOTE — Telephone Encounter (Signed)
Pt's spouse Crystal called in to be advised. Pt is scheduled to establish care with Dr. Lorelei Pont on 04/20/17. Spouse says that pt will be out of his adderall medication before scheduled appt. Spouse would like to know if PCP could work pt in to see her sooner OR if she is able to provide him with a Rx of medication before he runs out?    Please advise.   I will call spouse back at [phone] [facility]) to advise.

## 2017-03-13 NOTE — Telephone Encounter (Signed)
15 is fine. I know him

## 2017-03-18 NOTE — Telephone Encounter (Signed)
Pt has been scheduled a 15 minute visit. Pt is scheduled for 03/19/17 at 6:15 with provider.

## 2017-03-19 ENCOUNTER — Ambulatory Visit (INDEPENDENT_AMBULATORY_CARE_PROVIDER_SITE_OTHER): Payer: BLUE CROSS/BLUE SHIELD | Admitting: Family Medicine

## 2017-03-19 ENCOUNTER — Encounter: Payer: Self-pay | Admitting: Family Medicine

## 2017-03-19 VITALS — BP 124/86 | HR 89 | Temp 98.6°F | Ht 71.0 in | Wt 323.8 lb

## 2017-03-19 DIAGNOSIS — F988 Other specified behavioral and emotional disorders with onset usually occurring in childhood and adolescence: Secondary | ICD-10-CM

## 2017-03-19 DIAGNOSIS — N469 Male infertility, unspecified: Secondary | ICD-10-CM

## 2017-03-19 MED ORDER — AMPHETAMINE-DEXTROAMPHETAMINE 20 MG PO TABS
20.0000 mg | ORAL_TABLET | Freq: Three times a day (TID) | ORAL | 0 refills | Status: DC
Start: 2017-03-19 — End: 2017-07-10

## 2017-03-19 MED ORDER — AMPHETAMINE-DEXTROAMPHETAMINE 20 MG PO TABS
20.0000 mg | ORAL_TABLET | Freq: Three times a day (TID) | ORAL | 0 refills | Status: DC
Start: 2017-03-19 — End: 2017-03-19

## 2017-03-19 NOTE — Progress Notes (Signed)
[place] at [facility] [address] [place], [place] [address] [place]  Date:  03/19/2017   Name:  Craig Jordan.   DOB:  [birth year]   MRN:  [id number]  PCP:  Reginia Forts, MD    Chief Complaint: Follow-up (Pt here for f/u visit)   History of Present Illness:  Craig Jordan. is a 46 y.o. very pleasant male patient who presents with the following:  Last seen by myself a couple of years ago, but I know this pt well from [facility] Just got back from [facility] for vacation  He was having some trouble getting his meds in a timely manner History of ADD- treated with adderall Reviewed [place]; last filled a 30 day supply on 03/10/17.  No unexpected entries   Also history of obesity, high cholesterol, glaucoma, snoring  He continues to do well on the adderall 20 TID He ran out for 12 days and really felt it; he could not concentrate as well at work or at home. Part of this time he was on vacation which was a blessing.    No issues with his sleep or appetite He was dx with glaucoma- he went for his check up at [facility] vision and is using drops for this   His father is having surgery for cervical spinal stenosis per NSG  His 6yo nephew lives with him and his wife- they hope to have him full time. His mother works third shift so they are acting really in a parental role right now.    He and his wife have tried for a baby but they did not have any luck. His wife has been pregnant once (with a former partner) but had an ectopic and now only has one tube.  Craig Jordan has never impregnated a woman per his knowledge.  His wife is 7 yo. They are not sure of how to proceed next   BP Readings from Last 3 Encounters:  03/19/17 124/86  01/29/17 118/80  08/29/16 128/80    Wt Readings from Last 3 Encounters:  03/19/17 (!) 323 lb 12.8 oz (146.9 kg)  01/29/17 (!) 326 lb 6.4 oz (148.1 kg)  08/29/16 (!) 319 lb (144.7 kg)    Patient Active  Problem List   Diagnosis Date Noted  . Pure hypercholesterolemia 08/29/2016  . Glaucoma 02/05/2015  . Snoring 02/11/2013  . Chest pain, atypical 12/20/2012  . ADD (attention deficit disorder) 12/13/2012  . Obesity 12/13/2012    Past Medical History:  Diagnosis Date  . ADHD (attention deficit hyperactivity disorder)   . Allergy   . Glaucoma   . Hyperlipidemia   . Obesity     Past Surgical History:  Procedure Laterality Date  . KNEE SURGERY     arthroscopic -  B meniscus repairs    Social History  Substance Use Topics  . Smoking status: Former Smoker    Quit date: 05/17/1994  . Smokeless tobacco: Never Used  . Alcohol use No     Comment: "Stopped 2 weeks ago" stated on 07/28/2014    Family History  Problem Relation Age of Onset  . Seizures Father   . Epilepsy Father   . Arthritis Father     hip OA s/p hip replacement  . Sleep apnea Brother     No Known Allergies  Medication list has been reviewed and updated.  Current Outpatient Prescriptions on File Prior to Visit  Medication Sig Dispense Refill  .  amphetamine-dextroamphetamine (ADDERALL) 20 MG tablet Take 1 tablet (20 mg total) by mouth 3 (three) times daily. 90 tablet 0  . aspirin EC 81 MG tablet Take 405 mg by mouth once.    . benzonatate (TESSALON) 200 MG capsule Take 1 capsule (200 mg total) by mouth 2 (two) times daily as needed for cough. 20 capsule 0  . diclofenac (VOLTAREN) 75 MG EC tablet TAKE 1 TABLET(75 MG) BY MOUTH TWICE DAILY (Patient not taking: Reported on 01/29/2017) 30 tablet 0  . HYDROcodone-homatropine (HYCODAN) 5-1.5 MG/5ML syrup Take 2.5-5 mLs by mouth 2 (two) times daily as needed (Do not drive while taking this medication). 60 mL 0  . naproxen (NAPROSYN) 500 MG tablet Take 1 tablet (500 mg total) by mouth 2 (two) times daily with a meal. 30 tablet 0  . oxymetazoline (AFRIN) 0.05 % nasal spray Place 2 sprays into the nose at bedtime as needed. For nasal congestion    . pravastatin (PRAVACHOL)  40 MG tablet Take 1 tablet (40 mg total) by mouth daily. 90 tablet 3  . Travoprost, BAK Free, (TRAVATAN) 0.004 % SOLN ophthalmic solution Place 1 drop into both eyes at bedtime.     No current facility-administered medications on file prior to visit.     Review of Systems:  As per HPI- otherwise negative.   Physical Examination: Vitals:   03/19/17 1822  BP: 124/86  Pulse: 89  Temp: 98.6 F (37 C)   Vitals:   03/19/17 1822  Weight: (!) 323 lb 12.8 oz (146.9 kg)  Height: 5' 11"  (1.803 m)   Body mass index is 45.16 kg/m. Ideal Body Weight: Weight in (lb) to have BMI = 25: 178.9  GEN: WDWN, NAD, Non-toxic, A & O x 3, obese, looks well HEENT: Atraumatic, Normocephalic. Neck supple. No masses, No LAD. Ears and Nose: No external deformity. CV: RRR, No M/G/R. No JVD. No thrill. No extra heart sounds. PULM: CTA B, no wheezes, crackles, rhonchi. No retractions. No resp. distress. No accessory muscle use. EXTR: No c/c/e NEURO Normal gait.  PSYCH: Normally interactive. Conversant. Not depressed or anxious appearing.  Calm demeanor.    Assessment and Plan: Male infertility  Attention deficit disorder (ADD) without hyperactivity - Plan: amphetamine-dextroamphetamine (ADDERALL) 20 MG tablet, DISCONTINUED: amphetamine-dextroamphetamine (ADDERALL) 20 MG tablet, DISCONTINUED: amphetamine-dextroamphetamine (ADDERALL) 20 MG tablet  Refilled his adderall for May, June and July Will plan to recheck here every 6 months Discussed next steps for infertility evaluation and encouraged him to address this quickly if they do hope for pregnancy due to his wife's age.  He plans to discuss this with her  Signed Lamar Blinks, MD

## 2017-03-19 NOTE — Patient Instructions (Signed)
It was great to see you again today- take care!  Please let me know when you need refills of your adderall again  I would encourage you to take steps soon if you and your wife want to try for a baby.   Please seek a semen analysis (through a urologist, or may be available through a fertility doctor who can also evaluate your wife).     [facility] Address: [address] [place], [place] [address]  Phone: [phone]    [facility] Address: [address], [place], [place] [address]         Phone: [phone]

## 2017-04-20 ENCOUNTER — Ambulatory Visit: Payer: BLUE CROSS/BLUE SHIELD | Admitting: Family Medicine

## 2017-07-06 ENCOUNTER — Telehealth: Payer: Self-pay | Admitting: Family Medicine

## 2017-07-06 DIAGNOSIS — F988 Other specified behavioral and emotional disorders with onset usually occurring in childhood and adolescence: Secondary | ICD-10-CM

## 2017-07-06 NOTE — Telephone Encounter (Signed)
Patients wife calling for amphetamine-dextroamphetamine (ADDERALL) 20 MG tablet refill patient completely out  [phone]

## 2017-07-08 ENCOUNTER — Other Ambulatory Visit: Payer: Self-pay | Admitting: Family Medicine

## 2017-07-08 NOTE — Telephone Encounter (Signed)
Caller name: Relation to CR:FVOH Call back [phone] Pharmacy:  Reason for call:  Pt is needing rx amphetamine-dextroamphetamine (ADDERALL) 20 MG tablet, please call when available and ready for pick up

## 2017-07-10 MED ORDER — AMPHETAMINE-DEXTROAMPHETAMINE 20 MG PO TABS: 20.0000 mg | ORAL_TABLET | Freq: Three times a day (TID) | ORAL | 0 refills | Status: DC

## 2017-07-10 NOTE — Telephone Encounter (Signed)
Pt's spouse called in to follow up on refill request. I forwarded to covering provider for assistance.

## 2017-07-10 NOTE — Telephone Encounter (Signed)
Will send to DOD

## 2017-07-10 NOTE — Telephone Encounter (Signed)
OK to give 1 mo. TY.

## 2017-07-10 NOTE — Addendum Note (Signed)
Addended by: Sharon Seller B on: 07/10/2017 01:25 PM   Modules accepted: Orders

## 2017-07-10 NOTE — Telephone Encounter (Signed)
Thanks guys for catching this

## 2017-07-10 NOTE — Telephone Encounter (Signed)
Called and spoke to his wife to pickup hardcopy at the front desk. I did apologize for the delay in getting this prescription.

## 2017-07-10 NOTE — Telephone Encounter (Signed)
This request was sent to Santiago Glad on 07/06/17 (but she has not been here and evidently no one has viewed this request) Patient is currently out/ Last refill on 03/19/2017   #90 no refills on 03/19/2017 No Contract or UDS. LET ME know ,thanks

## 2017-07-13 NOTE — Telephone Encounter (Signed)
Checked [facility]- rx was filled

## 2017-07-13 NOTE — Telephone Encounter (Signed)
Called him back and [facility]- this was filled by Dr. Nani Ravens on Friday so he can pick it up.  If he needs anything else please let me know!

## 2017-07-14 NOTE — Progress Notes (Signed)
[place] at [facility] [address] [place], [place] [address] [place]  Date:  07/16/2017   Name:  Craig Jordan.   DOB:  05/18/1971   MRN:  [id number]  PCP:  Darreld Mclean, MD    Chief Complaint: Follow-up (Medication)   History of Present Illness:  Craig Jordan. is a 46 y.o. very pleasant male patient who presents with the following:  Here today for a periodic medication recheck Last visit here in May  No issues with his sleep or appetite He was dx with glaucoma- he went for his check up at [facility] vision and is using drops for this   His father is having surgery for cervical spinal stenosis per NSG  His 6yo nephew lives with him and his wife- they hope to have him full time. His mother works third shift so they are acting really in a parental role right now.    He and his wife have tried for a baby but they did not have any luck. His wife has been pregnant once (with a former partner) but had an ectopic and now only has one tube.  Amit has never impregnated a woman per his knowledge.  His wife is 43 yo. They are not sure of how to proceed next    Indication for chronic stimulant: add Medication and dose: adderall 20 TID # pills per month: 90 Last UDS date: not done Pain contract signed (Y/N): needs today Date narcotic database last reviewed (include red flags): 8/28  [facility]: filled 8/24  He is working a lot, no changes really He has noted a mild cough at night for about one month No wheezing at night at all He plans to try using an expectorant for his cough No nausea, vomiting or diarrhea Appetite is fine  He would like a flu shot today BP Readings from Last 3 Encounters:  07/16/17 127/90  03/19/17 124/86  01/29/17 118/80      Patient Active Problem List   Diagnosis Date Noted  . Pure hypercholesterolemia 08/29/2016  . Glaucoma 02/05/2015  . Snoring 02/11/2013  . Chest pain,  atypical 12/20/2012  . ADD (attention deficit disorder) 12/13/2012  . Obesity 12/13/2012    Past Medical History:  Diagnosis Date  . ADHD (attention deficit hyperactivity disorder)   . Allergy   . Glaucoma   . Hyperlipidemia   . Obesity     Past Surgical History:  Procedure Laterality Date  . KNEE SURGERY     arthroscopic -  B meniscus repairs    Social History  Substance Use Topics  . Smoking status: Former Smoker    Quit date: 05/17/1994  . Smokeless tobacco: Never Used  . Alcohol use No     Comment: "Stopped 2 weeks ago" stated on 07/28/2014    Family History  Problem Relation Age of Onset  . Seizures Father   . Epilepsy Father   . Arthritis Father        hip OA s/p hip replacement  . Sleep apnea Brother     No Known Allergies  Medication list has been reviewed and updated.  Current Outpatient Prescriptions on File Prior to Visit  Medication Sig Dispense Refill  . aspirin EC 81 MG tablet Take 405 mg by mouth once.    Marland Kitchen oxymetazoline (AFRIN) 0.05 % nasal spray Place 2 sprays into the nose at bedtime as needed. For nasal congestion    .  pravastatin (PRAVACHOL) 40 MG tablet Take 1 tablet (40 mg total) by mouth daily. 90 tablet 3  . Travoprost, BAK Free, (TRAVATAN) 0.004 % SOLN ophthalmic solution Place 1 drop into both eyes at bedtime.     No current facility-administered medications on file prior to visit.     Review of Systems:  As per HPI- otherwise negative.   Physical Examination: Vitals:   07/16/17 1648  BP: 127/90  Pulse: 76  Temp: 98.5 F (36.9 C)  SpO2: 98%   Vitals:   07/16/17 1648  Weight: (!) 324 lb 9.6 oz (147.2 kg)  Height: 5' 11"  (1.803 m)   Body mass index is 45.27 kg/m. Ideal Body Weight: Weight in (lb) to have BMI = 25: 178.9  GEN: WDWN, NAD, Non-toxic, A & O x 3, obese, otherwise looks well HEENT: Atraumatic, Normocephalic. Neck supple. No masses, No LAD.  Bilateral TM wnl, oropharynx normal.  PEERL,EOMI.   Ears and Nose: No  external deformity. CV: RRR, No M/G/R. No JVD. No thrill. No extra heart sounds. PULM: CTA B, no wheezes, crackles, rhonchi. No retractions. No resp. distress. No accessory muscle use. ABD: S, NT, ND, +BS. No rebound. No HSM. EXTR: No c/c/e NEURO Normal gait.  PSYCH: Normally interactive. Conversant. Not depressed or anxious appearing.  Calm demeanor.    Assessment and Plan: Attention deficit disorder (ADD) without hyperactivity - Plan: amphetamine-dextroamphetamine (ADDERALL) 20 MG tablet, DISCONTINUED: amphetamine-dextroamphetamine (ADDERALL) 20 MG tablet  Cough  Here today for a medication refill Refill adderrall for 2 more months CSC done today He will alert me if cough is not resolved soon- Sooner if worse.     Signed Lamar Blinks, MD

## 2017-07-16 ENCOUNTER — Ambulatory Visit (INDEPENDENT_AMBULATORY_CARE_PROVIDER_SITE_OTHER): Payer: BLUE CROSS/BLUE SHIELD | Admitting: Family Medicine

## 2017-07-16 ENCOUNTER — Encounter: Payer: Self-pay | Admitting: Family Medicine

## 2017-07-16 VITALS — BP 127/90 | HR 76 | Temp 98.5°F | Ht 71.0 in | Wt 324.6 lb

## 2017-07-16 DIAGNOSIS — R059 Cough, unspecified: Secondary | ICD-10-CM

## 2017-07-16 DIAGNOSIS — R05 Cough: Secondary | ICD-10-CM | POA: Diagnosis not present

## 2017-07-16 DIAGNOSIS — F988 Other specified behavioral and emotional disorders with onset usually occurring in childhood and adolescence: Secondary | ICD-10-CM | POA: Diagnosis not present

## 2017-07-16 MED ORDER — AMPHETAMINE-DEXTROAMPHETAMINE 20 MG PO TABS
20.0000 mg | ORAL_TABLET | Freq: Three times a day (TID) | ORAL | 0 refills | Status: DC
Start: 2017-07-16 — End: 2017-07-16

## 2017-07-16 MED ORDER — AMPHETAMINE-DEXTROAMPHETAMINE 20 MG PO TABS: 20.0000 mg | ORAL_TABLET | Freq: Three times a day (TID) | ORAL | 0 refills | Status: DC

## 2017-07-16 NOTE — Patient Instructions (Signed)
It was a pleasure to see you today as always!  Take care and please plan to see me for a recheck and physical in 3-6 months.

## 2017-07-23 ENCOUNTER — Telehealth: Payer: Self-pay | Admitting: Family Medicine

## 2017-07-23 NOTE — Telephone Encounter (Signed)
Called and [facility]- he had mentioned a cough briefly at our last visit.  If still sick please let me know

## 2017-08-05 ENCOUNTER — Emergency Department (HOSPITAL_COMMUNITY): Payer: BLUE CROSS/BLUE SHIELD

## 2017-08-05 ENCOUNTER — Emergency Department (HOSPITAL_COMMUNITY)
Admission: EM | Admit: 2017-08-05 | Discharge: 2017-08-05 | Disposition: A | Discharge status: Home / Self Care | Payer: BLUE CROSS/BLUE SHIELD | Attending: Emergency Medicine | Admitting: Emergency Medicine

## 2017-08-05 ENCOUNTER — Encounter (HOSPITAL_COMMUNITY): Payer: Self-pay | Admitting: *Deleted

## 2017-08-05 DIAGNOSIS — S92512A Displaced fracture of proximal phalanx of left lesser toe(s), initial encounter for closed fracture: Secondary | ICD-10-CM | POA: Insufficient documentation

## 2017-08-05 DIAGNOSIS — S92412A Displaced fracture of proximal phalanx of left great toe, initial encounter for closed fracture: Secondary | ICD-10-CM | POA: Diagnosis not present

## 2017-08-05 DIAGNOSIS — Y998 Other external cause status: Secondary | ICD-10-CM | POA: Insufficient documentation

## 2017-08-05 DIAGNOSIS — Y92099 Unspecified place in other non-institutional residence as the place of occurrence of the external cause: Secondary | ICD-10-CM | POA: Insufficient documentation

## 2017-08-05 DIAGNOSIS — Z87891 Personal history of nicotine dependence: Secondary | ICD-10-CM | POA: Insufficient documentation

## 2017-08-05 DIAGNOSIS — Z79899 Other long term (current) drug therapy: Secondary | ICD-10-CM | POA: Diagnosis not present

## 2017-08-05 DIAGNOSIS — Z7982 Long term (current) use of aspirin: Secondary | ICD-10-CM | POA: Diagnosis not present

## 2017-08-05 DIAGNOSIS — Y939 Activity, unspecified: Secondary | ICD-10-CM | POA: Diagnosis not present

## 2017-08-05 DIAGNOSIS — S99922A Unspecified injury of left foot, initial encounter: Secondary | ICD-10-CM | POA: Diagnosis not present

## 2017-08-05 DIAGNOSIS — W228XXA Striking against or struck by other objects, initial encounter: Secondary | ICD-10-CM | POA: Diagnosis not present

## 2017-08-05 MED ORDER — OXYCODONE-ACETAMINOPHEN 5-325 MG PO TABS
1.0000 | ORAL_TABLET | ORAL | Status: DC | PRN
  Administered 2017-08-05: 1 via ORAL

## 2017-08-05 MED ORDER — OXYCODONE-ACETAMINOPHEN 5-325 MG PO TABS
ORAL_TABLET | ORAL | Status: AC
  Filled 2017-08-05: qty 1

## 2017-08-05 MED ORDER — OXYCODONE-ACETAMINOPHEN 5-325 MG PO TABS: 1.0000 | ORAL_TABLET | Freq: Four times a day (QID) | ORAL | 0 refills | Status: DC | PRN

## 2017-08-05 NOTE — ED Triage Notes (Signed)
Pt reports hitting L foot on toolbox yesterday, pt has swelling and contusion to L foot with 4th toe appearing deformed, pt ambulatory, skin intact, A&O x4

## 2017-08-05 NOTE — ED Notes (Signed)
Ortho tech at bedside

## 2017-08-05 NOTE — Progress Notes (Signed)
Orthopedic Tech Progress Note Patient Details:  Weaver Tweed January 13, 1971 [id number]  Ortho Devices Type of Ortho Device: Buddy tape, Postop shoe/boot Ortho Device/Splint Location: lle Ortho Device/Splint Interventions: Application   Hildred Priest 08/05/2017, 4:37 PM

## 2017-08-05 NOTE — ED Provider Notes (Signed)
[facility] DEPT Provider Note   CSN: [id number] Arrival date & time: 08/05/17  1109   History   Chief Complaint Chief Complaint  Patient presents with  . Foot Pain    HPI Craig Kmetz. is a 46 y.o. male.  HPI   Patient is a 46 y/o male who presents to ED with left foot pain and pain in left 4th toe. Pt ran into a heavy toolbox with his left foot at home. 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 Did not try any OTC pain medications. Pain relief to 5/10 with Percocet in ED. Denies any other injury.   Past Medical History:  Diagnosis Date  . ADHD (attention deficit hyperactivity disorder)   . Allergy   . Glaucoma   . Hyperlipidemia   . Obesity     Patient Active Problem List   Diagnosis Date Noted  . Pure hypercholesterolemia 08/29/2016  . Glaucoma 02/05/2015  . Snoring 02/11/2013  . Chest pain, atypical 12/20/2012  . ADD (attention deficit disorder) 12/13/2012  . Obesity 12/13/2012    Past Surgical History:  Procedure Laterality Date  . KNEE SURGERY     arthroscopic -  B meniscus repairs       Home Medications    Prior to Admission medications   Medication Sig Start Date End Date Taking? Authorizing Provider  amphetamine-dextroamphetamine (ADDERALL) 20 MG tablet Take 1 tablet (20 mg total) by mouth 3 (three) times daily. Ok to fill in 30 days 07/16/17   [place], Gay Filler, MD  aspirin EC 81 MG tablet Take 405 mg by mouth once.    [provider]  oxyCODONE-acetaminophen (PERCOCET/ROXICET) 5-325 MG tablet Take 1-2 tablets by mouth every 6 (six) hours as needed. 08/05/17   Delos Haring, PA-C  oxymetazoline (AFRIN) 0.05 % nasal spray Place 2 sprays into the nose at bedtime as needed. For nasal congestion    [provider]  pravastatin (PRAVACHOL) 40 MG tablet Take 1 tablet (40 mg total) by mouth daily. 08/29/16   Wardell Honour, MD  Travoprost, BAK Free, (TRAVATAN) 0.004  % SOLN ophthalmic solution Place 1 drop into both eyes at bedtime.    [provider]    Family History Family History  Problem Relation Age of Onset  . Seizures Father   . Epilepsy Father   . Arthritis Father        hip OA s/p hip replacement  . Sleep apnea Brother     Social History Social History  Substance Use Topics  . Smoking status: Former Smoker    Quit date: 05/17/1994  . Smokeless tobacco: Never Used  . Alcohol use No     Comment: "Stopped 2 weeks ago" stated on 07/28/2014     Allergies   Patient has no known allergies.   Review of Systems Review of Systems The patient denies anorexia, fever, weight loss,, vision loss, decreased hearing, hoarseness, chest pain, syncope, dyspnea on exertion, peripheral edema, balance deficits, hemoptysis, abdominal pain, melena, hematochezia, severe indigestion/heartburn, hematuria, incontinence, genital sores, muscle weakness, suspicious skin lesions, transient blindness, difficulty walking, depression, unusual weight change, abnormal bleeding, enlarged lymph nodes, angioedema, and breast masses.   Physical Exam Updated Vital Signs BP (!) 143/92 (BP Location: Right Arm)   Pulse 67   Temp 98.5 F (36.9 C) (Oral)   Resp 14   Ht 5' 11"  (1.803 m)   Wt (!) 147.4 kg (325  lb)   SpO2 97%   BMI 45.33 kg/m   Physical Exam  Constitutional: He appears well-developed and well-nourished. No distress.  HENT:  Head: Normocephalic and atraumatic.  Eyes: Pupils are equal, round, and reactive to light.  Neck: Normal range of motion. Neck supple.  Cardiovascular: Normal rate and regular rhythm.   Pulmonary/Chest: Effort normal.  Abdominal: Soft.  Musculoskeletal:  Ecchymosis over dorsal aspect of foot and 4th phalanx. TTP of MTP joint. Active ROM flexion and extension of toes at MTP joint. 5/5 strength plantar and dorsiflexion of BL ankles. Decreased sensation in distal aspect of 4th digit, sensation intact in rest of digits and  foot. Skin intact.  Neurological: He is alert.  Skin: Skin is warm and dry.  Nursing note and vitals reviewed.    ED Treatments / Results  Labs (all labs ordered are listed, but only abnormal results are displayed) Labs Reviewed - No data to display  EKG  EKG Interpretation None       Radiology Dg Foot Complete Left  Result Date: 08/05/2017 CLINICAL DATA:  Blunt trauma to the fourth toe with pain and swelling, initial encounter EXAM: LEFT FOOT - COMPLETE 3+ VIEW COMPARISON:  None. FINDINGS: There is a fracture through the base of the fourth proximal phalanx best visualized on the lateral projection. It appears to extend into the articular surface at the metatarsal-phalangeal joint. No other definitive fracture is noted. No other focal abnormality is seen. IMPRESSION: Fourth proximal phalangeal fracture with involvement into the fourth MTP joint. Electronically Signed   By: Inez Catalina M.D.   On: 08/05/2017 12:28    Procedures Procedures (including critical care time)  Medications Ordered in ED Medications  oxyCODONE-acetaminophen (PERCOCET/ROXICET) 5-325 MG per tablet 1 tablet (1 tablet Oral Not Given 08/05/17 1215)     Initial Impression / Assessment and Plan / ED Course  I have reviewed the triage vital signs and the nursing notes.  Pertinent labs & imaging results that were available during my care of the patient were reviewed by me and considered in my medical decision making (see chart for details).   Patient is a 46 y/o male who presents to ED with left foot pain and pain in left 4th toe. Left foot X-ray shows proximal phalanx fracture extending into the MTP joint. Taped left 4th digit to 3rd digit to stabilize (buddy tape) and placed left foot in a post-operative boot to keep foot flat. Advised patient to use crutches he has at home to avoid weight-bearing until he can see the Orthopedic specialist. Advised patient to use Naproxen 588m PO Q12hrs for pain control, can  use Percocet for breath through pain. [facility]    Final Clinical Impressions(s) / ED Diagnoses   Final diagnoses:  Closed displaced fracture of proximal phalanx of lesser toe of left foot, initial encounter    New Prescriptions New Prescriptions   OXYCODONE-ACETAMINOPHEN (PERCOCET/ROXICET) 5-325 MG TABLET    Take 1-2 tablets by mouth every 6 (six) hours as needed.     GDelos Haring PA-C 08/05/17 1606    WDaleen Bo MD 08/05/17 [phone]

## 2017-08-17 DIAGNOSIS — S92515A Nondisplaced fracture of proximal phalanx of left lesser toe(s), initial encounter for closed fracture: Secondary | ICD-10-CM | POA: Diagnosis not present

## 2017-09-09 DIAGNOSIS — S92515D Nondisplaced fracture of proximal phalanx of left lesser toe(s), subsequent encounter for fracture with routine healing: Secondary | ICD-10-CM | POA: Diagnosis not present

## 2017-10-04 NOTE — Progress Notes (Deleted)
[place] at [facility] [address], [place], [place] [address] [place]  Date:  10/05/2017   Name:  Craig Jordan.   DOB:  10-May-1971   MRN:  [id number]  PCP:  Darreld Mclean, MD    Chief Complaint: No chief complaint on file.   History of Present Illness:  Craig Jordan. is a 46 y.o. very pleasant male patient who presents with the following:  Follow-up visit today History of high cholesterol ADD, obesity, glaucoma  Last seen by myself in August He did break a toe in September and was seen in the ER  Indication for chronic stimulant: add Medication and dose: adderall 20 TID # pills per month: 90 Last UDS date: not done Pain contract signed (Y/N): UTD Date narcotic database last reviewed (include red flags): 11/18- no concerns  Flu: Labs are a year old now  Patient Active Problem List   Diagnosis Date Noted  . Pure hypercholesterolemia 08/29/2016  . Glaucoma 02/05/2015  . Snoring 02/11/2013  . Chest pain, atypical 12/20/2012  . ADD (attention deficit disorder) 12/13/2012  . Obesity 12/13/2012    Past Medical History:  Diagnosis Date  . ADHD (attention deficit hyperactivity disorder)   . Allergy   . Glaucoma   . Hyperlipidemia   . Obesity     Past Surgical History:  Procedure Laterality Date  . KNEE SURGERY     arthroscopic -  B meniscus repairs    Social History   Tobacco Use  . Smoking status: Former Smoker    Last attempt to quit: 05/17/1994    Years since quitting: 23.4  . Smokeless tobacco: Never Used  Substance Use Topics  . Alcohol use: No    Alcohol/week: 0.0 oz    Comment: "Stopped 2 weeks ago" stated on 07/28/2014  . Drug use: Yes    Frequency: 7.0 times per week    Types: Marijuana    Family History  Problem Relation Age of Onset  . Seizures Father   . Epilepsy Father   . Arthritis Father        hip OA s/p hip replacement  . Sleep apnea Brother     No Known  Allergies  Medication list has been reviewed and updated.  Current Outpatient Medications on File Prior to Visit  Medication Sig Dispense Refill  . amphetamine-dextroamphetamine (ADDERALL) 20 MG tablet Take 1 tablet (20 mg total) by mouth 3 (three) times daily. Ok to fill in 30 days 90 tablet 0  . aspirin EC 81 MG tablet Take 405 mg by mouth once.    Marland Kitchen oxyCODONE-acetaminophen (PERCOCET/ROXICET) 5-325 MG tablet Take 1-2 tablets by mouth every 6 (six) hours as needed. 20 tablet 0  . oxymetazoline (AFRIN) 0.05 % nasal spray Place 2 sprays into the nose at bedtime as needed. For nasal congestion    . pravastatin (PRAVACHOL) 40 MG tablet Take 1 tablet (40 mg total) by mouth daily. 90 tablet 3  . Travoprost, BAK Free, (TRAVATAN) 0.004 % SOLN ophthalmic solution Place 1 drop into both eyes at bedtime.     No current facility-administered medications on file prior to visit.     Review of Systems:  As per HPI- otherwise negative.   Physical Examination: There were no vitals filed for this visit. There were no vitals filed for this visit. There is no height or weight on file to calculate BMI. Ideal Body Weight:    GEN:  WDWN, NAD, Non-toxic, A & O x 3 HEENT: Atraumatic, Normocephalic. Neck supple. No masses, No LAD. Ears and Nose: No external deformity. CV: RRR, No M/G/R. No JVD. No thrill. No extra heart sounds. PULM: CTA B, no wheezes, crackles, rhonchi. No retractions. No resp. distress. No accessory muscle use. ABD: S, NT, ND, +BS. No rebound. No HSM. EXTR: No c/c/e NEURO Normal gait.  PSYCH: Normally interactive. Conversant. Not depressed or anxious appearing.  Calm demeanor.    Assessment and Plan: ***  Signed Lamar Blinks, MD

## 2017-10-05 ENCOUNTER — Ambulatory Visit: Payer: BLUE CROSS/BLUE SHIELD | Admitting: Family Medicine

## 2017-10-06 NOTE — Progress Notes (Addendum)
[place] at [facility] [address] [place], [place] [address] [place]  Date:  10/07/2017   Name:  Craig Jordan.   DOB:  02/12/1971   MRN:  [id number]  PCP:  Darreld Mclean, MD    Chief Complaint: No chief complaint on file.   History of Present Illness:  Craig Jordan. is a 46 y.o. 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  He endorses feeling well rested when he wakes in the am and does not feel tired during the day  Wt Readings from Last 3 Encounters:  10/07/17 (!) 327 lb (148.3 kg)  08/05/17 (!) 325 lb (147.4 kg)  07/16/17 (!) 324 lb 9.6 oz (147.2 kg)     Patient Active Problem List   Diagnosis Date Noted  . Pure hypercholesterolemia 08/29/2016  . Glaucoma 02/05/2015  . Snoring 02/11/2013  . Chest pain, atypical 12/20/2012  . ADD (attention deficit disorder) 12/13/2012  . Obesity 12/13/2012    Past Medical History:  Diagnosis Date  . ADHD (attention deficit hyperactivity disorder)   . Allergy   . Glaucoma   . Hyperlipidemia   . Obesity     Past Surgical History:  Procedure Laterality Date  . KNEE SURGERY     arthroscopic -  B meniscus repairs    Social History   Tobacco Use  . Smoking status: Former Smoker    Last attempt to quit: 05/17/1994   Years since quitting: 23.4  . Smokeless tobacco: Never Used  Substance Use Topics  . Alcohol use: No    Alcohol/week: 0.0 oz    Comment: "Stopped 2 weeks ago" stated on 07/28/2014  . Drug use: Yes    Frequency: 7.0 times per week    Types: Marijuana    Family History  Problem Relation Age of Onset  . Seizures Father   . Epilepsy Father   . Arthritis Father        hip OA s/p hip replacement  . Sleep apnea Brother     No Known Allergies  Medication list has been reviewed and updated.  Current Outpatient Medications on File Prior to Visit  Medication Sig Dispense Refill  . amphetamine-dextroamphetamine (ADDERALL) 20 MG tablet Take 1 tablet (20 mg total) by mouth 3 (three) times daily. Ok to fill in 30 days 90 tablet 0  . aspirin EC 81 MG tablet Take 405 mg by mouth once.    Marland Kitchen oxyCODONE-acetaminophen (PERCOCET/ROXICET) 5-325 MG tablet Take 1-2 tablets by mouth every 6 (six) hours as needed. 20 tablet 0  . oxymetazoline (AFRIN) 0.05 % nasal spray Place 2 sprays into the nose at bedtime as needed. For nasal congestion    . pravastatin (PRAVACHOL)  40 MG tablet Take 1 tablet (40 mg total) by mouth daily. 90 tablet 3  . Travoprost, BAK Free, (TRAVATAN) 0.004 % SOLN ophthalmic solution Place 1 drop into both eyes at bedtime.     No current facility-administered medications on file prior to visit.     Review of Systems:  As per HPI- otherwise negative.  No fever or chills No CP or SOB No rash No ST or cough    Physical Examination: Vitals:   10/07/17 1208  BP: 128/85  Pulse: 90  Temp: 98.4 F (36.9 C)   Vitals:   10/07/17 1208  Weight: (!) 327 lb (148.3 kg)   Body mass index is 45.61 kg/m. Ideal Body Weight:    GEN: WDWN, NAD, Non-toxic, A & O x 3, obese, looks well HEENT: Atraumatic, Normocephalic. Neck supple. No masses, No LAD.  Bilateral TM wnl, oropharynx normal.  PEERL,EOMI.   Ears and Nose: No external deformity. CV: RRR, No M/G/R. No JVD. No thrill. No  extra heart sounds. PULM: CTA B, no wheezes, crackles, rhonchi. No retractions. No resp. distress. No accessory muscle use. ABD: S, NT, ND, +BS. No rebound. No HSM. EXTR: No c/c/e NEURO Normal gait.  PSYCH: Normally interactive. Conversant. Not depressed or anxious appearing.  Calm demeanor.    Assessment and Plan: Attention deficit disorder (ADD) without hyperactivity - Plan: amphetamine-dextroamphetamine (ADDERALL) 20 MG tablet, amphetamine-dextroamphetamine (ADDERALL) 20 MG tablet, amphetamine-dextroamphetamine (ADDERALL) 20 MG tablet  Hyperlipidemia, unspecified hyperlipidemia type - Plan: Lipid panel  Pure hypercholesterolemia - Plan: pravastatin (PRAVACHOL) 40 MG tablet  Family history of prostate cancer - Plan: PSA  Screening for diabetes mellitus - Plan: Comprehensive metabolic panel, Hemoglobin A1c  Medication monitoring encounter - Plan: CBC, Comprehensive metabolic panel, Pain Mgmt, Profile 8 w/Conf, U  Immunization due - Plan: Flu Vaccine QUAD 6+ mos PF IM (Fluarix Quad PF)  Follow-up visit today Flu shot Labs pending as above Refilled his adderall for 3 months UDS today- he does confide that he uses MJ sometimes     Signed Lamar Blinks, MD  Received his labs 11/23- letter to pt  Results for orders placed or performed in visit on 10/07/17  CBC  Result Value Ref Range   WBC 6.6 4.0 - 10.5 K/uL   RBC 4.97 4.22 - 5.81 Mil/uL   Platelets 251.0 150.0 - 400.0 K/uL   Hemoglobin 14.6 13.0 - 17.0 g/dL   HCT 44.1 39.0 - 52.0 %   MCV 88.9 78.0 - 100.0 fl   MCHC 33.2 30.0 - 36.0 g/dL   RDW 13.0 11.5 - 15.5 %  Comprehensive metabolic panel  Result Value Ref Range   Sodium 140 135 - 145 mEq/L   Potassium 4.8 3.5 - 5.1 mEq/L   Chloride 105 96 - 112 mEq/L   CO2 29 19 - 32 mEq/L   Glucose, Bld 101 (H) 70 - 99 mg/dL   BUN 13 6 - 23 mg/dL   Creatinine, Ser 0.99 0.40 - 1.50 mg/dL   Total Bilirubin 0.3 0.2 - 1.2 mg/dL   Alkaline Phosphatase 77 39 - 117 U/L   AST 16  0 - 37 U/L   ALT 22 0 - 53 U/L   Total Protein 6.4 6.0 - 8.3 g/dL   Albumin 4.1 3.5 - 5.2 g/dL   Calcium 9.7 8.4 - 10.5 mg/dL   GFR 86.90 >60.00 mL/min  Hemoglobin A1c  Result Value Ref Range   Hgb A1c MFr Bld 5.5 4.6 - 6.5 %  Lipid panel  Result Value Ref Range   Cholesterol 178 0 - 200 mg/dL   Triglycerides 117.0 0.0 - 149.0 mg/dL   HDL 52.50 >39.00 mg/dL   VLDL 23.4 0.0 - 40.0 mg/dL   LDL Cholesterol 102 (H) 0 - 99 mg/dL   Total CHOL/HDL Ratio 3    NonHDL 125.23   PSA  Result Value Ref Range   PSA 0.50 0.10 - 4.00 ng/mL

## 2017-10-07 ENCOUNTER — Encounter: Payer: Self-pay | Admitting: Family Medicine

## 2017-10-07 ENCOUNTER — Ambulatory Visit: Payer: BLUE CROSS/BLUE SHIELD | Admitting: Family Medicine

## 2017-10-07 VITALS — BP 128/85 | HR 90 | Temp 98.4°F | Wt 327.0 lb

## 2017-10-07 DIAGNOSIS — E7800 Pure hypercholesterolemia, unspecified: Secondary | ICD-10-CM | POA: Diagnosis not present

## 2017-10-07 DIAGNOSIS — Z5181 Encounter for therapeutic drug level monitoring: Secondary | ICD-10-CM

## 2017-10-07 DIAGNOSIS — F988 Other specified behavioral and emotional disorders with onset usually occurring in childhood and adolescence: Secondary | ICD-10-CM | POA: Diagnosis not present

## 2017-10-07 DIAGNOSIS — Z131 Encounter for screening for diabetes mellitus: Secondary | ICD-10-CM

## 2017-10-07 DIAGNOSIS — Z23 Encounter for immunization: Secondary | ICD-10-CM | POA: Diagnosis not present

## 2017-10-07 DIAGNOSIS — E785 Hyperlipidemia, unspecified: Secondary | ICD-10-CM | POA: Diagnosis not present

## 2017-10-07 DIAGNOSIS — Z8042 Family history of malignant neoplasm of prostate: Secondary | ICD-10-CM | POA: Diagnosis not present

## 2017-10-07 LAB — LIPID PANEL
CHOLESTEROL: 178 mg/dL (ref 0–200)
HDL: 52.5 mg/dL (ref >39.00)
LDL Cholesterol: 102 mg/dL — ABNORMAL HIGH (ref 0–99)
NonHDL: 125.23
Total CHOL/HDL Ratio: 3
Triglycerides: 117 mg/dL (ref 0.0–149.0)
VLDL: 23.4 mg/dL (ref 0.0–40.0)

## 2017-10-07 LAB — COMPREHENSIVE METABOLIC PANEL
ALBUMIN: 4.1 g/dL (ref 3.5–5.2)
ALT: 22 U/L (ref 0–53)
AST: 16 U/L (ref 0–37)
Alkaline Phosphatase: 77 U/L (ref 39–117)
BILIRUBIN TOTAL: 0.3 mg/dL (ref 0.2–1.2)
BUN: 13 mg/dL (ref 6–23)
CALCIUM: 9.7 mg/dL (ref 8.4–10.5)
CHLORIDE: 105 meq/L (ref 96–112)
CO2: 29 mEq/L (ref 19–32)
CREATININE: 0.99 mg/dL (ref 0.40–1.50)
GFR: 86.9 mL/min (ref >60.00)
Glucose, Bld: 101 mg/dL — ABNORMAL HIGH (ref 70–99)
Potassium: 4.8 mEq/L (ref 3.5–5.1)
Sodium: 140 mEq/L (ref 135–145)
Total Protein: 6.4 g/dL (ref 6.0–8.3)

## 2017-10-07 LAB — CBC
HCT: 44.1 % (ref 39.0–52.0)
HEMOGLOBIN: 14.6 g/dL (ref 13.0–17.0)
MCHC: 33.2 g/dL (ref 30.0–36.0)
MCV: 88.9 fl (ref 78.0–100.0)
PLATELETS: 251 10*3/uL (ref 150.0–400.0)
RBC: 4.97 Mil/uL (ref 4.22–5.81)
RDW: 13 % (ref 11.5–15.5)
WBC: 6.6 10*3/uL (ref 4.0–10.5)

## 2017-10-07 LAB — PSA: PSA: 0.5 ng/mL (ref 0.10–4.00)

## 2017-10-07 LAB — HEMOGLOBIN A1C: HEMOGLOBIN A1C: 5.5 % (ref 4.6–6.5)

## 2017-10-07 MED ORDER — AMPHETAMINE-DEXTROAMPHETAMINE 20 MG PO TABS: 20.0000 mg | ORAL_TABLET | Freq: Three times a day (TID) | ORAL | 0 refills | Status: DC

## 2017-10-07 MED ORDER — PRAVASTATIN SODIUM 40 MG PO TABS: 40.0000 mg | ORAL_TABLET | Freq: Every day | ORAL | 3 refills | Status: DC

## 2017-10-07 NOTE — Patient Instructions (Signed)
Always a pleasure to see you in the office!  Take care and please see me in 6 months I will be in touch with your labs Please also do a urine drug screen in the lab today  Continue to work on diet and gradual weight oss!

## 2017-10-12 LAB — PAIN MGMT, PROFILE 8 W/CONF, U
6 Acetylmorphine: NEGATIVE ng/mL (ref <10)
ALCOHOL METABOLITES: NEGATIVE ng/mL (ref <500)
Amphetamine: 9808 ng/mL — ABNORMAL HIGH (ref <250)
Amphetamines: POSITIVE ng/mL — AB (ref <500)
BUPRENORPHINE, URINE: NEGATIVE ng/mL (ref <5)
Benzodiazepines: NEGATIVE ng/mL (ref <100)
CREATININE: 166.5 mg/dL
Cocaine Metabolite: NEGATIVE ng/mL (ref <150)
MDMA: NEGATIVE ng/mL (ref <500)
METHAMPHETAMINE: NEGATIVE ng/mL (ref <250)
Marijuana Metabolite: 3060 ng/mL — ABNORMAL HIGH (ref <5)
Marijuana Metabolite: POSITIVE ng/mL — AB (ref <20)
OPIATES: NEGATIVE ng/mL (ref <100)
OXIDANT: NEGATIVE ug/mL (ref <200)
OXYCODONE: NEGATIVE ng/mL (ref <100)
pH: 6.43 (ref 4.5–9.0)

## 2017-12-17 ENCOUNTER — Ambulatory Visit: Payer: BLUE CROSS/BLUE SHIELD | Admitting: Family Medicine

## 2018-01-06 DIAGNOSIS — H401131 Primary open-angle glaucoma, bilateral, mild stage: Secondary | ICD-10-CM | POA: Diagnosis not present

## 2018-01-08 ENCOUNTER — Other Ambulatory Visit: Payer: Self-pay | Admitting: Family Medicine

## 2018-01-08 DIAGNOSIS — F988 Other specified behavioral and emotional disorders with onset usually occurring in childhood and adolescence: Secondary | ICD-10-CM

## 2018-01-08 MED ORDER — AMPHETAMINE-DEXTROAMPHETAMINE 20 MG PO TABS: 20.0000 mg | ORAL_TABLET | Freq: Three times a day (TID) | ORAL | 0 refills | Status: DC

## 2018-01-08 NOTE — Telephone Encounter (Signed)
Pt is requesting refill on Adderall.

## 2018-01-08 NOTE — Telephone Encounter (Signed)
Copied from [place] #[id number]. Topic: Quick Communication - Rx Refill/Question >> Jan 08, 2018  2:56 PM Cleaster Corin, NT wrote: Medication: Adderall    Has the patient contacted their pharmacy? no   (Agent: If no, request that the patient contact the pharmacy for the refill.)   Preferred Pharmacy (with phone number or street name): [facility] [phone] [address], [place] [address] [place] [address] Phone: [phone] Fax: [phone]     Agent: Please be advised that RX refills may take up to 3 business days. We ask that you follow-up with your pharmacy.

## 2018-01-08 NOTE — Telephone Encounter (Signed)
Adderall  LOV 10/07/17  Dr. Lorelei Pont  Rx. verified

## 2018-01-08 NOTE — Telephone Encounter (Signed)
Last seen in November UDS:11/18 Contract done [place]: today, all ok refilled

## 2018-01-16 NOTE — Progress Notes (Deleted)
[place] at [facility] [address] [place], [place] [address] [phone] [id number]  Date:  01/18/2018   Name:  Craig Jordan.   DOB:  August 08, 1971   MRN:  [id number]  PCP:  Darreld Mclean, MD    Chief Complaint: No chief complaint on file.   History of Present Illness:  Craig Sonn. is a 47 y.o. very pleasant male patient who presents with the following:  Following up on his ADD today Contract signed UDS in November of 2018 I filled 3 months of rx on 2/22  NCCSR:   He is also on pravastatin for hyperlipidemia-   Lipids/ full labs in November    Component Value Date/Time   CHOL 178 10/07/2017 1237   TRIG 117.0 10/07/2017 1237   HDL 52.50 10/07/2017 1237   VLDL 23.4 10/07/2017 1237   CHOLHDL 3 10/07/2017 1237   BP Readings from Last 3 Encounters:  10/07/17 128/85  08/05/17 (!) 149/86  07/16/17 127/90      Patient Active Problem List   Diagnosis Date Noted  . Pure hypercholesterolemia 08/29/2016  . Glaucoma 02/05/2015  . Snoring 02/11/2013  . Chest pain, atypical 12/20/2012  . ADD (attention deficit disorder) 12/13/2012  . Obesity 12/13/2012    Past Medical History:  Diagnosis Date  . ADHD (attention deficit hyperactivity disorder)   . Allergy   . Glaucoma   . Hyperlipidemia   . Obesity     Past Surgical History:  Procedure Laterality Date  . KNEE SURGERY     arthroscopic -  B meniscus repairs    Social History   Tobacco Use  . Smoking status: Former Smoker    Last attempt to quit: 05/17/1994    Years since quitting: 23.6  . Smokeless tobacco: Never Used  Substance Use Topics  . Alcohol use: No    Alcohol/week: 0.0 oz    Comment: "Stopped 2 weeks ago" stated on 07/28/2014  . Drug use: Yes    Frequency: 7.0 times per week    Types: Marijuana    Family History  Problem Relation Age of Onset  . Seizures Father   . Epilepsy Father   . Arthritis Father        hip OA s/p hip  replacement  . Sleep apnea Brother     No Known Allergies  Medication list has been reviewed and updated.  Current Outpatient Medications on File Prior to Visit  Medication Sig Dispense Refill  . amphetamine-dextroamphetamine (ADDERALL) 20 MG tablet Take 1 tablet (20 mg total) by mouth 3 (three) times daily. 90 tablet 0  . amphetamine-dextroamphetamine (ADDERALL) 20 MG tablet Take 1 tablet (20 mg total) by mouth 3 (three) times daily. Ok to fill in 30 days 90 tablet 0  . amphetamine-dextroamphetamine (ADDERALL) 20 MG tablet Take 1 tablet (20 mg total) by mouth 3 (three) times daily. Ok to fill in 60 days 90 tablet 0  . aspirin EC 81 MG tablet Take 405 mg by mouth once.    Marland Kitchen oxyCODONE-acetaminophen (PERCOCET/ROXICET) 5-325 MG tablet Take 1-2 tablets by mouth every 6 (six) hours as needed. 20 tablet 0  . oxymetazoline (AFRIN) 0.05 % nasal spray Place 2 sprays into the nose at bedtime as needed. For nasal congestion    . pravastatin (PRAVACHOL) 40 MG tablet Take 1 tablet (40 mg total) by mouth daily. 90 tablet 3  . Travoprost, BAK Free, (TRAVATAN) 0.004 % SOLN ophthalmic solution Place  1 drop into both eyes at bedtime.     No current facility-administered medications on file prior to visit.     Review of Systems:  ***  Physical Examination: There were no vitals filed for this visit. There were no vitals filed for this visit. There is no height or weight on file to calculate BMI. Ideal Body Weight:    ***  Assessment and Plan: ***  Signed Lamar Blinks, MD

## 2018-01-18 ENCOUNTER — Ambulatory Visit: Payer: BLUE CROSS/BLUE SHIELD | Admitting: Family Medicine

## 2018-01-18 ENCOUNTER — Telehealth: Payer: Self-pay | Admitting: Family Medicine

## 2018-01-18 NOTE — Telephone Encounter (Unsigned)
Copied from [place]. Topic: Quick Communication - See Telephone Encounter >> Jan 18, 2018 10:20 AM Neva Seat wrote: Pt isn't understanding the new Rx process for his medication to be called in now.  Pt wants Dr. Lillie Fragmin nurse to give him a call.

## 2018-01-19 ENCOUNTER — Encounter: Payer: Self-pay | Admitting: Family Medicine

## 2018-01-20 NOTE — Telephone Encounter (Signed)
Spoke with pt everything is fine pt was able to get medications it was a mistake on the Pharmacy end

## 2018-01-27 NOTE — Telephone Encounter (Signed)
Copied from [place]. Topic: Bill or Statement - Patient/Guarantor Inquiry >> Jan 25, 2018 11:26 AM Ether Griffins B wrote: Pt had an appt scheduled for 01/18/18 and received a no show letter with a charge of $50 on it. But they spoke with Dr. Lorelei Pont prior to the appt and she stated he didn't need to come in for the appt and they where under the impression the appt would have been canceled since the provider told them it wasn't needed.  >> Jan 25, 2018  8:23 PM Jerene Dilling H wrote: Centralized coding is unable to remove NS fees.. CRM sent to the office

## 2018-02-15 NOTE — Telephone Encounter (Signed)
Copied from [place]. Topic: Bill or Statement - Patient/Guarantor Inquiry >> Jan 25, 2018 11:26 AM Ether Griffins B wrote: Pt had an appt scheduled for 01/18/18 and received a no show letter with a charge of $50 on it. But they spoke with Dr. Lorelei Pont prior to the appt and she stated he didn't need to come in for the appt and they where under the impression the appt would have been canceled since the provider told them it wasn't needed.  >> Jan 25, 2018  8:23 PM Jerene Dilling H wrote: Centralized coding is unable to remove NS fees.. CRM sent to the office

## 2018-03-29 ENCOUNTER — Telehealth: Payer: Self-pay | Admitting: Family Medicine

## 2018-03-29 DIAGNOSIS — F988 Other specified behavioral and emotional disorders with onset usually occurring in childhood and adolescence: Secondary | ICD-10-CM

## 2018-03-29 MED ORDER — AMPHETAMINE-DEXTROAMPHETAMINE 20 MG PO TABS: 20.0000 mg | ORAL_TABLET | Freq: Three times a day (TID) | ORAL | 0 refills | Status: DC

## 2018-03-29 NOTE — Telephone Encounter (Signed)
Request for Adderall 39m.

## 2018-03-29 NOTE — Telephone Encounter (Signed)
Copied from [place] [phone]. Topic: Quick Communication - Rx Refill/Question >> Mar 29, 2018  1:28 PM Ether Griffins B wrote: Medication: amphetamine-dextroamphetamine (ADDERALL) 20 MG tablet Has the patient contacted their pharmacy? Yes.   (Agent: If no, request that the patient contact the pharmacy for the refill.) Preferred Pharmacy (with phone number or street name): [facility] [id number] - [place], [place] - [address] Agent: Please be advised that RX refills may take up to 3 business days. We ask that you follow-up with your pharmacy.

## 2018-04-08 ENCOUNTER — Encounter: Payer: Self-pay | Admitting: Family Medicine

## 2018-05-10 DIAGNOSIS — H401121 Primary open-angle glaucoma, left eye, mild stage: Secondary | ICD-10-CM | POA: Diagnosis not present

## 2018-05-10 DIAGNOSIS — H401111 Primary open-angle glaucoma, right eye, mild stage: Secondary | ICD-10-CM | POA: Diagnosis not present

## 2018-05-16 ENCOUNTER — Emergency Department (HOSPITAL_COMMUNITY): Payer: BLUE CROSS/BLUE SHIELD

## 2018-05-16 ENCOUNTER — Emergency Department (HOSPITAL_COMMUNITY)
Admission: EM | Admit: 2018-05-16 | Discharge: 2018-05-16 | Disposition: A | Discharge status: Home / Self Care | Payer: BLUE CROSS/BLUE SHIELD | Attending: Emergency Medicine | Admitting: Emergency Medicine

## 2018-05-16 ENCOUNTER — Encounter (HOSPITAL_COMMUNITY): Payer: Self-pay

## 2018-05-16 DIAGNOSIS — Z87891 Personal history of nicotine dependence: Secondary | ICD-10-CM | POA: Insufficient documentation

## 2018-05-16 DIAGNOSIS — Z7982 Long term (current) use of aspirin: Secondary | ICD-10-CM | POA: Diagnosis not present

## 2018-05-16 DIAGNOSIS — M25551 Pain in right hip: Secondary | ICD-10-CM | POA: Diagnosis not present

## 2018-05-16 DIAGNOSIS — Z79899 Other long term (current) drug therapy: Secondary | ICD-10-CM | POA: Diagnosis not present

## 2018-05-16 MED ORDER — KETOROLAC TROMETHAMINE 30 MG/ML IJ SOLN: 30.0000 mg | Freq: Once | INTRAMUSCULAR | Status: DC

## 2018-05-16 MED ORDER — NAPROXEN 500 MG PO TABS: 500.0000 mg | ORAL_TABLET | Freq: Two times a day (BID) | ORAL | 0 refills | Status: DC

## 2018-05-16 MED ORDER — OXYCODONE-ACETAMINOPHEN 5-325 MG PO TABS: 1.0000 | ORAL_TABLET | Freq: Three times a day (TID) | ORAL | 0 refills | Status: DC | PRN

## 2018-05-16 NOTE — ED Provider Notes (Signed)
[facility] EMERGENCY DEPARTMENT Provider Note   CSN: [id number] Arrival date & time: 05/16/18  1452     History   Chief Complaint Chief Complaint  Patient presents with  . Hip Pain    HPI Craig Nack. is a 47 y.o. male with a past medical history of ADHD, hyperlipidemia, obesity who presents to ED for evaluation of right hip pain.  He was kicking earlier when he felt a "pop" on his right hip.  He states that he sat down on the ground after the incident occurred due to the pain.  He states it felt like "when my shoulder pops out."  Denies any numbness in legs, prior hip surgeries, fractures, dislocations, back pain, urinary symptoms.  HPI  Past Medical History:  Diagnosis Date  . ADHD (attention deficit hyperactivity disorder)   . Allergy   . Glaucoma   . Hyperlipidemia   . Obesity     Patient Active Problem List   Diagnosis Date Noted  . Pure hypercholesterolemia 08/29/2016  . Glaucoma 02/05/2015  . Snoring 02/11/2013  . Chest pain, atypical 12/20/2012  . ADD (attention deficit disorder) 12/13/2012  . Obesity 12/13/2012    Past Surgical History:  Procedure Laterality Date  . KNEE SURGERY     arthroscopic -  B meniscus repairs        Home Medications    Prior to Admission medications   Medication Sig Start Date End Date Taking? Authorizing Provider  amphetamine-dextroamphetamine (ADDERALL) 20 MG tablet Take 1 tablet (20 mg total) by mouth 3 (three) times daily. Ok to fill in 30 days 01/08/18   Copland, Gay Filler, MD  amphetamine-dextroamphetamine (ADDERALL) 20 MG tablet Take 1 tablet (20 mg total) by mouth 3 (three) times daily. Ok to fill in 60 days 01/08/18   Copland, Gay Filler, MD  amphetamine-dextroamphetamine (ADDERALL) 20 MG tablet Take 1 tablet (20 mg total) by mouth 3 (three) times daily. Please come in for an office visit prior to further refills 03/29/18   Copland, Gay Filler, MD  aspirin EC 81 MG tablet Take 405 mg by mouth once.     [provider]  naproxen (NAPROSYN) 500 MG tablet Take 1 tablet (500 mg total) by mouth 2 (two) times daily. 05/16/18   Zakariah Urwin, PA-C  oxyCODONE-acetaminophen (PERCOCET/ROXICET) 5-325 MG tablet Take 1 tablet by mouth every 8 (eight) hours as needed for severe pain. 05/16/18   Juno Alers, PA-C  oxymetazoline (AFRIN) 0.05 % nasal spray Place 2 sprays into the nose at bedtime as needed. For nasal congestion    [provider]  pravastatin (PRAVACHOL) 40 MG tablet Take 1 tablet (40 mg total) by mouth daily. 10/07/17   Copland, Gay Filler, MD  Travoprost, BAK Free, (TRAVATAN) 0.004 % SOLN ophthalmic solution Place 1 drop into both eyes at bedtime.    [provider]    Family History Family History  Problem Relation Age of Onset  . Seizures Father   . Epilepsy Father   . Arthritis Father        hip OA s/p hip replacement  . Sleep apnea Brother     Social History Social History   Tobacco Use  . Smoking status: Former Smoker    Last attempt to quit: 05/17/1994    Years since quitting: 24.0  . Smokeless tobacco: Never Used  Substance Use Topics  . Alcohol use: No    Alcohol/week: 0.0 oz    Comment: "Stopped 2 weeks ago" stated  on 07/28/2014  . Drug use: Yes    Frequency: 7.0 times per week    Types: Marijuana     Allergies   Patient has no known allergies.   Review of Systems Review of Systems  Constitutional: Negative for chills and fever.  Musculoskeletal: Positive for arthralgias and gait problem. Negative for joint swelling and myalgias.  Skin: Negative for wound.  Neurological: Negative for weakness and numbness.     Physical Exam Updated Vital Signs BP 136/88 (BP Location: Left Arm)   Pulse 84   Temp 99.2 F (37.3 C) (Oral)   Resp 20   SpO2 100%   Physical Exam  Constitutional: He appears well-developed and well-nourished. No distress.  Nontoxic-appearing and in no acute distress.  Ambulatory with normal gait.  Able to bear  weight without issue.  HENT:  Head: Normocephalic and atraumatic.  Nose: Nose normal.  Eyes: Conjunctivae and EOM are normal. Left eye exhibits no discharge. No scleral icterus.  Neck: Normal range of motion. Neck supple.  Cardiovascular: Normal rate, regular rhythm, normal heart sounds and intact distal pulses. Exam reveals no gallop and no friction rub.  No murmur heard. Pulmonary/Chest: Effort normal and breath sounds normal. No respiratory distress.  Abdominal: Soft. Bowel sounds are normal. He exhibits no distension. There is no tenderness. There is no guarding.  Musculoskeletal: Normal range of motion. He exhibits no edema.  No tenderness to palpation of the right hip.  No changes to range of motion noted.  Normal sensation to bilateral lower extremities.  Neurological: He is alert. He exhibits normal muscle tone. Coordination normal.  Skin: Skin is warm and dry. No rash noted.  Psychiatric: He has a normal mood and affect.  Nursing note and vitals reviewed.    ED Treatments / Results  Labs (all labs ordered are listed, but only abnormal results are displayed) Labs Reviewed - No data to display  EKG None  Radiology Dg Hip Unilat  With Pelvis 2-3 Views Right  Result Date: 05/16/2018 CLINICAL DATA:  Pain. EXAM: DG HIP (WITH OR WITHOUT PELVIS) 2-3V RIGHT COMPARISON:  None. FINDINGS: A tiny calcification is seen the soft tissues adjacent to the inferior trochanter on one view. The AP and lateral views demonstrate no evidence of fracture or dislocation. Mild degenerative changes. The pelvic bones are unremarkable. The left hip is not well assessed due to positioning. By report, the patient is only having right-sided symptoms. IMPRESSION: 1. No fracture or dislocation of the right hip. 2. Mild degenerative changes in the right hip. 3. A calcification is seen in the soft tissues adjacent to the inferior trochanter on the right. This could represent enthesopathic change or sequela of an  age indeterminate avulsion injury. 4. Evaluation of the left hip is limited due to positioning but the patient was not reported have symptoms on the left. If there are left-sided symptoms, dedicated imaging would be necessary. Electronically Signed   By: Dorise Bullion III M.D   On: 05/16/2018 15:58    Procedures Procedures (including critical care time)  Medications Ordered in ED Medications  ketorolac (TORADOL) 30 MG/ML injection 30 mg (has no administration in time range)     Initial Impression / Assessment and Plan / ED Course  I have reviewed the triage vital signs and the nursing notes.  Pertinent labs & imaging results that were available during my care of the patient were reviewed by me and considered in my medical decision making (see chart for details).  47 year old male presents for evaluation of right hip pain.  He was kicking when he felt a "pop" on his right hip.  He sat on the ground of the incident occurred.  States that he was having pain in the area.  He was initially unable to bear weight.  However, on my examination he is ambulating with a normal gait.  States that his pain has improved.  Lower extremities are neurovascularly intact.  No deformity or changes to range of motion noted.  X-ray shows no acute fracture dislocation.  Does show possible enthesopathy.  Will advise patient to follow-up with his PCP or orthopedist regarding this.  Advised to return to ED for any severe worsening symptoms. [place] PMP reviewed with no discrepancies.  Portions of this note were generated with [employer]. Dictation errors may occur despite best attempts at proofreading.   Final Clinical Impressions(s) / ED Diagnoses   Final diagnoses:  Right hip pain    ED Discharge Orders        Ordered    oxyCODONE-acetaminophen (PERCOCET/ROXICET) 5-325 MG tablet  Every 8 hours PRN     05/16/18 1712    naproxen (NAPROSYN) 500 MG tablet  2 times daily     05/16/18 1712        Delia Heady, PA-C 05/16/18 1713    Milton Ferguson, MD 05/16/18 2141

## 2018-05-16 NOTE — ED Notes (Signed)
Spoke to PA.  Due to possible dislocation or fracture, patient will be upgraded to level 3 and xray monitored for result prior to rooming

## 2018-05-16 NOTE — ED Triage Notes (Signed)
Pt presents for evaluation of R hip pain. States he was kicking earlier and felt like hip was dislocated. Pt unable to bear weight.

## 2018-05-16 NOTE — Discharge Instructions (Signed)
Follow-up with your orthopedist for further evaluation. Return to ED for worsening symptoms, additional injuries or falls, numbness in legs, leg swelling or trouble walking.

## 2018-06-01 ENCOUNTER — Other Ambulatory Visit: Payer: Self-pay | Admitting: Family Medicine

## 2018-06-01 DIAGNOSIS — F988 Other specified behavioral and emotional disorders with onset usually occurring in childhood and adolescence: Secondary | ICD-10-CM

## 2018-06-01 NOTE — Telephone Encounter (Signed)
Adderall refill Last Refill:03/29/18 #90 Last OV: 10/07/17 PCP: Dr. Lorelei Pont Pharmacy: [facility] [place]

## 2018-06-01 NOTE — Telephone Encounter (Signed)
Copied from [place] [id number]. Topic: Quick Communication - Rx Refill/Question >> Jun 01, 2018  7:57 AM Synthia Innocent wrote: Medication: amphetamine-dextroamphetamine (ADDERALL) 20 MG tablet   Has the patient contacted their pharmacy? Yes.   (Agent: If no, request that the patient contact the pharmacy for the refill.) (Agent: If yes, when and what did the pharmacy advise?)  Preferred Pharmacy (with phone number or street name): [facility] [place]  Agent: Please be advised that RX refills may take up to 3 business days. We ask that you follow-up with your pharmacy.

## 2018-06-01 NOTE — Telephone Encounter (Signed)
Last refill in May included request for office visit.  Cannot refill.  Called pt and LMOM asking him to please come in for a visit prior to refill

## 2018-06-06 NOTE — Progress Notes (Signed)
[place] at [facility] [address], [place], [place] [address] [place]  Date:  06/07/2018   Name:  Craig Jordan.   DOB:  17-Dec-1970   MRN:  [id number]  PCP:  Darreld Mclean, MD    Chief Complaint: ADHD (Medication follow up)   History of Present Illness:  Craig Jordan. is a 47 y.o. 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  He endorses feeling well rested when he wakes in the am and does not feel tired during the day   NCCSR:  05/16/2018  3  05/16/2018  Oxycodone-Acetaminophen 5-325  4 1 Hi Kha  [id number]  Wal ([id number])  1/1 30.00 MME Comm Ins  [place]  05/01/2018  3  01/08/2018  Dextroamp-Amphetamin 20 Mg Tab  90 30 Je Cop  [id number]  Wal ([id number])  1/1  Comm Ins  [place]  03/29/2018  3  03/29/2018  Dextroamp-Amphetamin 20 Mg Tab  90 30 Je Cop  [id number]  Wal ([id number])  1/1  Comm Ins  [place]  02/23/2018  3  01/08/2018  Dextroamp-Amphetamin 20 Mg Tab  90 30 Je Cop  [id number]  Wal ([id number])  1/1  Comm Ins  [place]  01/19/2018  2  01/08/2018  Dextroamp-Amphetamin 20 Mg Tab  90 30 Je Cop  [id number]  Wal ([id number])  1/1  Comm Ins  [place]  12/15/2017  2  10/07/2017  Dextroamp-Amphetamin 20 Mg Tab  90 30 Je Cop  [id number]  Wal ([id number])  1/1  Comm Ins  [place]  11/11/2017  2  10/07/2017  Dextroamp-Amphetamin 20 Mg Tab  90 30 Je Cop  [id number]  Wal ([id number])  1/1  Comm Ins  [place]  10/12/2017  1  10/07/2017   Dextroamp-Amphetamin 20 Mg Tab  90 30 Je Cop  [id number]  Wal ([id number])  1/1  Comm Ins  [place]  09/14/2017  1  07/16/2017  Dextroamp-Amphetamin 20 Mg Tab  90 30 Je Cop  [id number]  Wal ([id number])  1/1  Comm Ins  [place]  08/10/2017  1  07/16/2017  Dextroamp-Amphetamin 20 Mg Tab  90 [place]  [id number]  Wal ([id number])  1       UDS 11/18 Labs: done in November, UTD He continues to tolerate his adderall well- he is sleeping and eating ok He notes that he sometimes feels like the medication does not totally control his ADD sx- however I explained that we cannot go higher than 60 mg.   He will try to not take the medication on his days off or other times that he does not need it to decrease tolerance.  This may help it to work better when he does need it  He also wonders about colon cancer screening- he was not sure when this should start . There is no family  history as far as he is aware. Discussed screening now vs at age 11, he will hold off for now     Patient Active Problem List   Diagnosis Date Noted  . Pure hypercholesterolemia 08/29/2016  . Glaucoma 02/05/2015  . Snoring 02/11/2013  . Chest pain, atypical 12/20/2012  . ADD (attention deficit disorder) 12/13/2012  . Obesity 12/13/2012    Past Medical History:  Diagnosis Date  . ADHD (attention deficit hyperactivity disorder)   . Allergy   . Glaucoma   . Hyperlipidemia   . Obesity     Past Surgical History:  Procedure Laterality Date  . KNEE SURGERY     arthroscopic -  B meniscus repairs    Social History   Tobacco Use  . Smoking status: Former Smoker    Last attempt to quit: 05/17/1994    Years since quitting: 24.0  . Smokeless tobacco: Never Used  Substance Use Topics  . Alcohol use: No    Alcohol/week: 0.0 oz    Comment: "Stopped 2 weeks ago" stated on 07/28/2014  . Drug use: Yes    Frequency: 7.0 times per week    Types: Marijuana    Family History  Problem Relation Age of Onset  . Seizures Father   . Epilepsy Father   . Arthritis  Father        hip OA s/p hip replacement  . Sleep apnea Brother     No Known Allergies  Medication list has been reviewed and updated.  Current Outpatient Medications on File Prior to Visit  Medication Sig Dispense Refill  . naproxen (NAPROSYN) 500 MG tablet Take 1 tablet (500 mg total) by mouth 2 (two) times daily. 30 tablet 0  . oxymetazoline (AFRIN) 0.05 % nasal spray Place 2 sprays into the nose at bedtime as needed. For nasal congestion    . pravastatin (PRAVACHOL) 40 MG tablet Take 1 tablet (40 mg total) by mouth daily. 90 tablet 3  . Travoprost, BAK Free, (TRAVATAN) 0.004 % SOLN ophthalmic solution Place 1 drop into both eyes at bedtime.     No current facility-administered medications on file prior to visit.     Review of Systems:  As per HPI- otherwise negative.   Physical Examination: Vitals:   06/07/18 1301  BP: 112/86  Pulse: 91  Resp: 18  SpO2: 97%   Vitals:   06/07/18 1301  Weight: (!) 324 lb (147 kg)  Height: 5' 11"  (1.803 m)   Body mass index is 45.19 kg/m. Ideal Body Weight: Weight in (lb) to have BMI = 25: 178.9  GEN: WDWN, NAD, Non-toxic, A & O x 3, obese, looks well  HEENT: Atraumatic, Normocephalic. Neck supple. No masses, No LAD. Ears and Nose: No external deformity. CV: RRR, No M/G/R. No JVD. No thrill. No extra heart sounds. PULM: CTA B, no wheezes, crackles, rhonchi. No retractions. No resp. distress. No accessory muscle use. ABD: S, NT, ND, +BS. No rebound. No HSM. EXTR: No c/c/e NEURO Normal gait.  PSYCH: Normally interactive. Conversant. Not depressed or anxious appearing.  Calm demeanor.    Assessment and Plan: Attention deficit disorder (ADD) without hyperactivity - Plan: amphetamine-dextroamphetamine (ADDERALL) 20 MG tablet, amphetamine-dextroamphetamine (ADDERALL) 20 MG tablet, amphetamine-dextroamphetamine (ADDERALL) 20 MG tablet  Hyperlipidemia, unspecified hyperlipidemia type  Following up today Stable on current dose of  adderall Taking his cholesterol meds without difficulty Plan to visit for a CPE this winter- about 6 months from now  Continue to encourage weight loss through diet and  exercise   Signed Lamar Blinks, MD

## 2018-06-07 ENCOUNTER — Encounter: Payer: Self-pay | Admitting: Family Medicine

## 2018-06-07 ENCOUNTER — Ambulatory Visit: Payer: BLUE CROSS/BLUE SHIELD | Admitting: Family Medicine

## 2018-06-07 VITALS — BP 112/86 | HR 91 | Resp 18 | Ht 71.0 in | Wt 324.0 lb

## 2018-06-07 DIAGNOSIS — E785 Hyperlipidemia, unspecified: Secondary | ICD-10-CM | POA: Diagnosis not present

## 2018-06-07 DIAGNOSIS — F988 Other specified behavioral and emotional disorders with onset usually occurring in childhood and adolescence: Secondary | ICD-10-CM

## 2018-06-07 MED ORDER — AMPHETAMINE-DEXTROAMPHETAMINE 20 MG PO TABS: 20.0000 mg | ORAL_TABLET | Freq: Three times a day (TID) | ORAL | 0 refills | Status: DC

## 2018-06-07 NOTE — Patient Instructions (Signed)
We can plan for a screening colonoscopy at age 47, but if you decide you would like to do earlier that is ok!  Just let me know Let's plan for a physical/ labs in about 6 months

## 2018-08-12 ENCOUNTER — Telehealth: Payer: Self-pay | Admitting: Family Medicine

## 2018-08-12 NOTE — Telephone Encounter (Signed)
Routed in error. Pt has refill available at the pharmacy.

## 2018-08-12 NOTE — Telephone Encounter (Signed)
Spoke with Roanna Epley at [facility] who states that the pt doses have a prescription of Adderall on file that will be refilled for the pt.

## 2018-08-12 NOTE — Telephone Encounter (Signed)
Copied from [place]. Topic: Quick Communication - Rx Refill/Question >> Aug 12, 2018 11:46 AM Oliver Pila B wrote: Medication: amphetamine-dextroamphetamine (ADDERALL) 20 MG tablet [[id number]]  amphetamine-dextroamphetamine (ADDERALL) 20 MG tablet [[id number]]  amphetamine-dextroamphetamine (ADDERALL) 20 MG tablet [[id number]]   Has the patient contacted their pharmacy? Yes.   (Agent: If no, request that the patient contact the pharmacy for the refill.) (Agent: If yes, when and what did the pharmacy advise?)  Preferred Pharmacy (with phone number or street name): [facility]  Agent: Please be advised that RX refills may take up to 3 business days. We ask that you follow-up with your pharmacy.

## 2018-09-01 ENCOUNTER — Other Ambulatory Visit: Payer: Self-pay | Admitting: Family Medicine

## 2018-09-01 DIAGNOSIS — E7800 Pure hypercholesterolemia, unspecified: Secondary | ICD-10-CM

## 2018-09-04 ENCOUNTER — Other Ambulatory Visit: Payer: Self-pay | Admitting: Family Medicine

## 2018-09-04 DIAGNOSIS — E7800 Pure hypercholesterolemia, unspecified: Secondary | ICD-10-CM

## 2018-09-06 DIAGNOSIS — H401131 Primary open-angle glaucoma, bilateral, mild stage: Secondary | ICD-10-CM | POA: Diagnosis not present

## 2018-09-17 ENCOUNTER — Other Ambulatory Visit: Payer: Self-pay | Admitting: Family Medicine

## 2018-09-17 DIAGNOSIS — F988 Other specified behavioral and emotional disorders with onset usually occurring in childhood and adolescence: Secondary | ICD-10-CM

## 2018-09-17 MED ORDER — AMPHETAMINE-DEXTROAMPHETAMINE 20 MG PO TABS: 20.0000 mg | ORAL_TABLET | Freq: Three times a day (TID) | ORAL | 0 refills | Status: DC

## 2018-09-17 NOTE — Telephone Encounter (Signed)
Copied from [place] [id number]. Topic: Quick Communication - Rx Refill/Question >> Sep 17, 2018  2:25 PM Percell Belt A wrote: Medication: amphetamine-dextroamphetamine (ADDERALL) 20 MG tablet [[id number]]   Has the patient contacted their pharmacy?  (Agent: If no, request that the patient contact the pharmacy for the refill.) (Agent: If yes, when and what did the pharmacy advise?)  Preferred Pharmacy (with phone number or street name): [facility], [place] - [address] [place] [address] [place] [address] [phone] (Phone)   Agent: Please be advised that RX refills may take up to 3 business days. We ask that you follow-up with your pharmacy.

## 2018-09-17 NOTE — Telephone Encounter (Signed)
Reviewed NCCSR: 08/12/2018  3   06/07/2018  Dextroamp-Amphetamin 20 Mg Tab  90.00 30 Je Cop  [id number]  Wal ([id number])  0/0  Comm Ins  [place]  07/08/2018  3   06/07/2018  Dextroamp-Amphetamin 20 Mg Tab  90.00 30 Je Cop  [id number]  Wal ([id number])  0/0  Comm Ins  [place]  06/07/2018  3   06/07/2018  Dextroamp-Amphetamin 20 Mg Tab  90.00 30 Je Cop  [id number]  Wal ([id number])  0/0  Comm Ins  [place]  05/16/2018  3   05/16/2018  Oxycodone-Acetaminophen 5-325  4.00 1 Hi Kha  [id number]  Wal ([id number])  0/0 30.00 MME Comm Ins  [place]  05/01/2018  3   01/08/2018  Dextroamp-Amphetamin 20 Mg Tab  90.00 30 Je Cop  [id number]  Wal ([id number])  0/0  Comm Ins  [place]  03/29/2018  3   03/29/2018  Dextroamp-Amphetamin 20 Mg Tab  90.00 30 Je Cop  [id number]  Wal (2191)  0/0  Comm Ins  [place]  02/23/2018  3   01/08/2018  Dextroamp-Amphetamin 20 Mg Tab  90.00 30 Je Cop  [id number]  Wal (2191)  0/0  Comm Ins  [place]  01/19/2018  2   01/08/2018  Dextroamp-Amphetamin 20 Mg Tab  90.00 30 Je Cop  [id number]  Wal ([id number])  0/0  Comm Ins  [place]  12/15/2017  2   10/07/2017  Dextroamp-Amphetamin 20 Mg Tab  90.00 30 Je Cop  [id number]  Wal ([id number])  0/0        Ok to reill

## 2018-09-17 NOTE — Telephone Encounter (Signed)
Requested medication (s) are due for refill today: yes  Requested medication (s) are on the active medication list: yes  Last refill:  08/08/18  Future visit scheduled: yes, 10/18/18  Notes to clinic: LOV on 06/07/18    Requested Prescriptions  Pending Prescriptions Disp Refills   amphetamine-dextroamphetamine (ADDERALL) 20 MG tablet 90 tablet 0    Sig: Take 1 tablet (20 mg total) by mouth 3 (three) times daily. Ok to fill in 60 days     Not Delegated - Psychiatry:  Stimulants/ADHD Failed - 09/17/2018  2:28 PM      Failed - This refill cannot be delegated      Failed - Urine Drug Screen completed in last 360 days.      Failed - Valid encounter within last 3 months    Recent Outpatient Visits          3 months ago Attention deficit disorder (ADD) without hyperactivity   [facility], MD   11 months ago Attention deficit disorder (ADD) without hyperactivity   [facility], MD   1 year ago Attention deficit disorder (ADD) without hyperactivity   [facility], Gay Filler, MD   1 year ago Male infertility   [facility], Gay Filler, MD   1 year ago Influenza-like illness   [facility], PA-C      Future Appointments            In 1 month [place], Gay Filler, MD [place] [facility]

## 2018-10-14 NOTE — Progress Notes (Addendum)
[place] at [facility] [address] [place], [place] [address] [place]  Date:  10/18/2018   Name:  Craig Jordan.   DOB:  13-Aug-1971   MRN:  [id number]  PCP:  Darreld Mclean, MD    Chief Complaint: Annual Exam (flu shot)   History of Present Illness:  Craig Fabela. is a 47 y.o. very pleasant male patient who presents with the following:  Here today for a physical exam- [birth year] birthday He has been ill with a URI- earache, ST, cough, PND He first noted these sx a month ago. He feels like he is mostly better but still not well His wife has been sick aswell He took a tylenol this am, and still has a low grade temp however He is taking mucinex  He notes sinus pressure and headaches History of hyperlipidemia, glaucoma, ADD,obesity  ADD treated with adderall Will get UDS today.  Pt confides that he does use MJ for anxiety   Labs: a year ago; he is NOT fasting.  Ate a steak, egg and cheese bagel Flu shot: defer today as he is ill  He thinks that his PGF had prostate cancer . Would like to start PSA screening   He is on pravastatin and has not noted any SE from this   He is active in his work and does not have any CP or SOB with exercise  He may rarely feel a momentary sharp pain in his chest- this is very sporadic and often occurs with anxiety. He does NOT notice this with exertion. Offered to set up an ETT but he prefers to observe for now.   BP Readings from Last 3 Encounters:  10/18/18 128/82  06/07/18 112/86  05/16/18 136/88   Wt Readings from Last 3 Encounters:  10/18/18 (!) 321 lb (145.6 kg)  06/07/18 (!) 324 lb (147 kg)  10/07/17 (!) 327 lb (148.3 kg)      Reviewed NCCSR: 09/17/2018  3   09/17/2018  Dextroamp-Amphetamin 20 Mg Tab  90.00 30 Je Cop  [id number]  Wal ([id number])  0/0  Comm Ins  [place]  08/12/2018  3   06/07/2018  Dextroamp-Amphetamin 20 Mg Tab  90.00 30 Je Cop  [id number]  Wal ([id number])  0/0   Comm Ins  [place]  07/08/2018  3   06/07/2018  Dextroamp-Amphetamin 20 Mg Tab  90.00 30 Je Cop  [id number]  Wal ([id number])  0/0  Comm Ins  [place]  06/07/2018  3   06/07/2018  Dextroamp-Amphetamin 20 Mg Tab  90.00 30 Je Cop  [id number]  Wal ([id number])  0/0  Comm Ins  [place]  05/16/2018  3   05/16/2018  Oxycodone-Acetaminophen 5-325  4.00 1 Hi Kha  [id number]  Wal ([id number])  0/0 30.00 MME Comm Ins  [place]  05/01/2018  3   01/08/2018  Dextroamp-Amphetamin 20 Mg Tab  90.00 30 Je Cop  [id number]  Wal (2191)  0/0  Comm Ins  [place]  03/29/2018  3   03/29/2018  Dextroamp-Amphetamin 20 Mg Tab  90.00 30 Je Cop  [id number]  Wal (2191)  0/0  Comm Ins  [place]  02/23/2018  3   01/08/2018  Dextroamp-Amphetamin 20 Mg Tab  90.00 30 Je Cop  [id number]  Wal (2191)  0/0  Comm Ins    01/19/2018  2   01/08/2018  Dextroamp-Amphetamin 20 Mg Tab  90.00 30 Je Cop  [id number]  Wal (  2191)  0/0  Comm Ins  [place]  12/15/2017  2   10/07/2017  Dextroamp-Amphetamin 20 Mg Tab  90.00 30 Je Cop  [id number]  Wal ([id number])  0/0  Comm Ins  [place]  11/11/2017  2   10/07/2017  Dextroamp-Amphetamin 20 Mg Tab  90.00 30 Je Cop  [id number]  Wal (8151)  0/0        Patient Active Problem List   Diagnosis Date Noted  . Pure hypercholesterolemia 08/29/2016  . Glaucoma 02/05/2015  . Snoring 02/11/2013  . Chest pain, atypical 12/20/2012  . ADD (attention deficit disorder) 12/13/2012  . Obesity 12/13/2012    Past Medical History:  Diagnosis Date  . ADHD (attention deficit hyperactivity disorder)   . Allergy   . Glaucoma   . Hyperlipidemia   . Obesity     Past Surgical History:  Procedure Laterality Date  . KNEE SURGERY     arthroscopic -  B meniscus repairs    Social History   Tobacco Use  . Smoking status: Former Smoker    Last attempt to quit: 05/17/1994    Years since quitting: 24.4  . Smokeless tobacco: Never Used  Substance Use Topics  . Alcohol use: No    Alcohol/week: 0.0 standard drinks    Comment: "Stopped 2 weeks ago" stated on 07/28/2014  . Drug use: Yes    Frequency: 7.0 times per  week    Types: Marijuana    Family History  Problem Relation Age of Onset  . Seizures Father   . Epilepsy Father   . Arthritis Father        hip OA s/p hip replacement  . Sleep apnea Brother     No Known Allergies  Medication list has been reviewed and updated.  Current Outpatient Medications on File Prior to Visit  Medication Sig Dispense Refill  . amphetamine-dextroamphetamine (ADDERALL) 20 MG tablet Take 1 tablet (20 mg total) by mouth 3 (three) times daily. Ok to fill in 60 days 90 tablet 0  . amphetamine-dextroamphetamine (ADDERALL) 20 MG tablet Take 1 tablet (20 mg total) by mouth 3 (three) times daily. Ok to fill in 30 days 90 tablet 0  . amphetamine-dextroamphetamine (ADDERALL) 20 MG tablet Take 1 tablet (20 mg total) by mouth 3 (three) times daily. 90 tablet 0  . oxymetazoline (AFRIN) 0.05 % nasal spray Place 2 sprays into the nose at bedtime as needed. For nasal congestion    . pravastatin (PRAVACHOL) 40 MG tablet TAKE 1 TABLET(40 MG) BY MOUTH DAILY 90 tablet 1  . Travoprost, BAK Free, (TRAVATAN) 0.004 % SOLN ophthalmic solution Place 1 drop into both eyes at bedtime.    . naproxen (NAPROSYN) 500 MG tablet Take 1 tablet (500 mg total) by mouth 2 (two) times daily. (Patient not taking: Reported on 10/18/2018) 30 tablet 0   No current facility-administered medications on file prior to visit.     Review of Systems:  As per HPI- otherwise negative. No chills No urinary sx    Physical Examination: Vitals:   10/18/18 1044  BP: 128/82  Pulse: 77  Resp: 18  Temp: 99 F (37.2 C)  SpO2: 98%   Vitals:   10/18/18 1044  Weight: (!) 321 lb (145.6 kg)  Height: 5' 11"  (1.803 m)   Body mass index is 44.77 kg/m. Ideal Body Weight: Weight in (lb) to have BMI = 25: 178.9  GEN: WDWN, NAD, Non-toxic, A & O x 3,obese,looks well  HEENT: Atraumatic, Normocephalic. Neck supple. No masses,  No LAD. Bilateral TM wnl, oropharynx normal.  PEERL,EOMI.   Nasal cavity is inflamed and  red Ears and Nose: No external deformity. CV: RRR, No M/G/R. No JVD. No thrill. No extra heart sounds. PULM: CTA B, no wheezes, crackles, rhonchi. No retractions. No resp. distress. No accessory muscle use. ABD: S, NT, ND, +BS. No rebound. No HSM. EXTR: No c/c/e NEURO Normal gait.  PSYCH: Normally interactive. Conversant. Not depressed or anxious appearing.  Calm demeanor.    Assessment and Plan: Physical exam  Attention deficit disorder (ADD) without hyperactivity - Plan: amphetamine-dextroamphetamine (ADDERALL) 20 MG tablet, amphetamine-dextroamphetamine (ADDERALL) 20 MG tablet, amphetamine-dextroamphetamine (ADDERALL) 20 MG tablet, Pain Mgmt, Profile 8 w/Conf, U, CANCELED: Pain Mgmt, Profile 8 w/Conf, U  Hyperlipidemia, unspecified hyperlipidemia type - Plan: Lipid panel  Screening for diabetes mellitus - Plan: Comprehensive metabolic panel, Hemoglobin A1c  Class 3 severe obesity due to excess calories with serious comorbidity and body mass index (BMI) of 40.0 to 44.9 in adult [facility])  Screening for deficiency anemia - Plan: CBC  Screening for prostate cancer - Plan: PSA  Acute non-recurrent frontal sinusitis - Plan: amoxicillin (AMOXIL) 500 MG capsule  CPE today Labs pending as above Refilled his adderall for 3 months Treat for sinus infection with amox He will get a flu shot once well Continue to work on weight loss If chest pains continue to be an issue he will alert me.   Signed Lamar Blinks, MD  Received his labs- letter to pt  Results for orders placed or performed in visit on 10/18/18  CBC  Result Value Ref Range   WBC 6.2 4.0 - 10.5 K/uL   RBC 5.09 4.22 - 5.81 Mil/uL   Platelets 230.0 150.0 - 400.0 K/uL   Hemoglobin 15.2 13.0 - 17.0 g/dL   HCT 44.1 39.0 - 52.0 %   MCV 86.8 78.0 - 100.0 fl   MCHC 34.5 30.0 - 36.0 g/dL   RDW 13.4 11.5 - 15.5 %  Comprehensive metabolic panel  Result Value Ref Range   Sodium 139 135 - 145 mEq/L   Potassium 4.1 3.5 - 5.1  mEq/L   Chloride 104 96 - 112 mEq/L   CO2 28 19 - 32 mEq/L   Glucose, Bld 86 70 - 99 mg/dL   BUN 14 6 - 23 mg/dL   Creatinine, Ser 1.09 0.40 - 1.50 mg/dL   Total Bilirubin 0.3 0.2 - 1.2 mg/dL   Alkaline Phosphatase 81 39 - 117 U/L   AST 18 0 - 37 U/L   ALT 30 0 - 53 U/L   Total Protein 6.7 6.0 - 8.3 g/dL   Albumin 4.2 3.5 - 5.2 g/dL   Calcium 9.5 8.4 - 10.5 mg/dL   GFR 77.41 >60.00 mL/min  Hemoglobin A1c  Result Value Ref Range   Hgb A1c MFr Bld 5.6 4.6 - 6.5 %  Lipid panel  Result Value Ref Range   Cholesterol 197 0 - 200 mg/dL   Triglycerides 157.0 (H) 0.0 - 149.0 mg/dL   HDL 62.50 >39.00 mg/dL   VLDL 31.4 0.0 - 40.0 mg/dL   LDL Cholesterol 103 (H) 0 - 99 mg/dL   Total CHOL/HDL Ratio 3    NonHDL 134.41   PSA  Result Value Ref Range   PSA 0.58 0.10 - 4.00 ng/mL

## 2018-10-18 ENCOUNTER — Ambulatory Visit (INDEPENDENT_AMBULATORY_CARE_PROVIDER_SITE_OTHER): Payer: BLUE CROSS/BLUE SHIELD | Admitting: Family Medicine

## 2018-10-18 ENCOUNTER — Encounter: Payer: Self-pay | Admitting: Family Medicine

## 2018-10-18 VITALS — BP 128/82 | HR 77 | Temp 99.0°F | Resp 18 | Ht 71.0 in | Wt 321.0 lb

## 2018-10-18 DIAGNOSIS — Z131 Encounter for screening for diabetes mellitus: Secondary | ICD-10-CM | POA: Diagnosis not present

## 2018-10-18 DIAGNOSIS — Z125 Encounter for screening for malignant neoplasm of prostate: Secondary | ICD-10-CM

## 2018-10-18 DIAGNOSIS — Z6841 Body Mass Index (BMI) 40.0 and over, adult: Secondary | ICD-10-CM

## 2018-10-18 DIAGNOSIS — F988 Other specified behavioral and emotional disorders with onset usually occurring in childhood and adolescence: Secondary | ICD-10-CM | POA: Diagnosis not present

## 2018-10-18 DIAGNOSIS — E785 Hyperlipidemia, unspecified: Secondary | ICD-10-CM | POA: Diagnosis not present

## 2018-10-18 DIAGNOSIS — Z130 Encounter for screening for diseases of the blood and blood-forming organs and certain disorders involving the immune mechanism: Secondary | ICD-10-CM | POA: Diagnosis not present

## 2018-10-18 DIAGNOSIS — J0110 Acute frontal sinusitis, unspecified: Secondary | ICD-10-CM

## 2018-10-18 DIAGNOSIS — Z0000 Encounter for general adult medical examination without abnormal findings: Secondary | ICD-10-CM

## 2018-10-18 LAB — LIPID PANEL
CHOL/HDL RATIO: 3
Cholesterol: 197 mg/dL (ref 0–200)
HDL: 62.5 mg/dL (ref >39.00)
LDL Cholesterol: 103 mg/dL — ABNORMAL HIGH (ref 0–99)
NonHDL: 134.41
Triglycerides: 157 mg/dL — ABNORMAL HIGH (ref 0.0–149.0)
VLDL: 31.4 mg/dL (ref 0.0–40.0)

## 2018-10-18 LAB — CBC
HEMATOCRIT: 44.1 % (ref 39.0–52.0)
HEMOGLOBIN: 15.2 g/dL (ref 13.0–17.0)
MCHC: 34.5 g/dL (ref 30.0–36.0)
MCV: 86.8 fl (ref 78.0–100.0)
Platelets: 230 10*3/uL (ref 150.0–400.0)
RBC: 5.09 Mil/uL (ref 4.22–5.81)
RDW: 13.4 % (ref 11.5–15.5)
WBC: 6.2 10*3/uL (ref 4.0–10.5)

## 2018-10-18 LAB — COMPREHENSIVE METABOLIC PANEL
ALBUMIN: 4.2 g/dL (ref 3.5–5.2)
ALK PHOS: 81 U/L (ref 39–117)
ALT: 30 U/L (ref 0–53)
AST: 18 U/L (ref 0–37)
BILIRUBIN TOTAL: 0.3 mg/dL (ref 0.2–1.2)
BUN: 14 mg/dL (ref 6–23)
CO2: 28 mEq/L (ref 19–32)
Calcium: 9.5 mg/dL (ref 8.4–10.5)
Chloride: 104 mEq/L (ref 96–112)
Creatinine, Ser: 1.09 mg/dL (ref 0.40–1.50)
GFR: 77.41 mL/min (ref >60.00)
GLUCOSE: 86 mg/dL (ref 70–99)
Potassium: 4.1 mEq/L (ref 3.5–5.1)
SODIUM: 139 meq/L (ref 135–145)
TOTAL PROTEIN: 6.7 g/dL (ref 6.0–8.3)

## 2018-10-18 LAB — PSA: PSA: 0.58 ng/mL (ref 0.10–4.00)

## 2018-10-18 LAB — HEMOGLOBIN A1C: HEMOGLOBIN A1C: 5.6 % (ref 4.6–6.5)

## 2018-10-18 MED ORDER — AMOXICILLIN 500 MG PO CAPS: 1000.0000 mg | ORAL_CAPSULE | Freq: Two times a day (BID) | ORAL | 0 refills | Status: DC

## 2018-10-18 MED ORDER — AMPHETAMINE-DEXTROAMPHETAMINE 20 MG PO TABS: 20.0000 mg | ORAL_TABLET | Freq: Three times a day (TID) | ORAL | 0 refills | Status: DC

## 2018-10-18 NOTE — Patient Instructions (Addendum)
Great to see you as always.  I will be in touch with your labs asap We refilled your adderall today  We are going to treat you for a sinus infection with amoxicillin. Please let me know if your sinus infection sx do not resolve. Please get a flu shot in a week or two   Please do continue to work on weight loss- you are down a few lbs which is a great start!   Health Maintenance, Male A healthy lifestyle and preventive care is important for your health and wellness. Ask your health care provider about what schedule of regular examinations is right for you. What should I know about weight and diet? Eat a Healthy Diet  Eat plenty of vegetables, fruits, whole grains, low-fat dairy products, and lean protein.  Do not eat a lot of foods high in solid fats, added sugars, or salt.  Maintain a Healthy Weight Regular exercise can help you achieve or maintain a healthy weight. You should:  Do at least 150 minutes of exercise each week. The exercise should increase your heart rate and make you sweat (moderate-intensity exercise).  Do strength-training exercises at least twice a week.  Watch Your Levels of Cholesterol and Blood Lipids  Have your blood tested for lipids and cholesterol every 5 years starting at 46 years of age. If you are at high risk for heart disease, you should start having your blood tested when you are 46 years old. You may need to have your cholesterol levels checked more often if: ? Your lipid or cholesterol levels are high. ? You are older than 46 years of age. ? You are at high risk for heart disease.  What should I know about cancer screening? Many types of cancers can be detected early and may often be prevented. Lung Cancer  You should be screened every year for lung cancer if: ? You are a current smoker who has smoked for at least 30 years. ? You are a former smoker who has quit within the past 15 years.  Talk to your health care provider about your screening  options, when you should start screening, and how often you should be screened.  Colorectal Cancer  Routine colorectal cancer screening usually begins at 46 years of age and should be repeated every 5-10 years until you are 46 years old. You may need to be screened more often if early forms of precancerous polyps or small growths are found. Your health care provider may recommend screening at an earlier age if you have risk factors for colon cancer.  Your health care provider may recommend using home test kits to check for hidden blood in the stool.  A small camera at the end of a tube can be used to examine your colon (sigmoidoscopy or colonoscopy). This checks for the earliest forms of colorectal cancer.  Prostate and Testicular Cancer  Depending on your age and overall health, your health care provider may do certain tests to screen for prostate and testicular cancer.  Talk to your health care provider about any symptoms or concerns you have about testicular or prostate cancer.  Skin Cancer  Check your skin from head to toe regularly.  Tell your health care provider about any new moles or changes in moles, especially if: ? There is a change in a mole's size, shape, or color. ? You have a mole that is larger than a pencil eraser.  Always use sunscreen. Apply sunscreen liberally and repeat throughout the day.  Protect yourself by wearing long sleeves, pants, a wide-brimmed hat, and sunglasses when outside.  What should I know about heart disease, diabetes, and high blood pressure?  If you are 42-11 years of age, have your blood pressure checked every 3-5 years. If you are 22 years of age or older, have your blood pressure checked every year. You should have your blood pressure measured twice-once when you are at a hospital or clinic, and once when you are not at a hospital or clinic. Record the average of the two measurements. To check your blood pressure when you are not at a hospital  or clinic, you can use: ? An automated blood pressure machine at a pharmacy. ? A home blood pressure monitor.  Talk to your health care provider about your target blood pressure.  If you are between 36-48 years old, ask your health care provider if you should take aspirin to prevent heart disease.  Have regular diabetes screenings by checking your fasting blood sugar level. ? If you are at a normal weight and have a low risk for diabetes, have this test once every three years after the age of 67. ? If you are overweight and have a high risk for diabetes, consider being tested at a younger age or more often.  A one-time screening for abdominal aortic aneurysm (AAA) by ultrasound is recommended for men aged 35-75 years who are current or former smokers. What should I know about preventing infection? Hepatitis B If you have a higher risk for hepatitis B, you should be screened for this virus. Talk with your health care provider to find out if you are at risk for hepatitis B infection. Hepatitis C Blood testing is recommended for:  Everyone born from [birth year] through 1964.  Anyone with known risk factors for hepatitis C.  Sexually Transmitted Diseases (STDs)  You should be screened each year for STDs including gonorrhea and chlamydia if: ? You are sexually active and are younger than 46 years of age. ? You are older than 46 years of age and your health care provider tells you that you are at risk for this type of infection. ? Your sexual activity has changed since you were last screened and you are at an increased risk for chlamydia or gonorrhea. Ask your health care provider if you are at risk.  Talk with your health care provider about whether you are at high risk of being infected with HIV. Your health care provider may recommend a prescription medicine to help prevent HIV infection.  What else can I do?  Schedule regular health, dental, and eye exams.  Stay current with your vaccines  (immunizations).  Do not use any tobacco products, such as cigarettes, chewing tobacco, and e-cigarettes. If you need help quitting, ask your health care provider.  Limit alcohol intake to no more than 2 drinks per day. One drink equals 12 ounces of beer, 5 ounces of wine, or 1 ounces of hard liquor.  Do not use street drugs.  Do not share needles.  Ask your health care provider for help if you need support or information about quitting drugs.  Tell your health care provider if you often feel depressed.  Tell your health care provider if you have ever been abused or do not feel safe at home. This information is not intended to replace advice given to you by your health care provider. Make sure you discuss any questions you have with your health care provider. Document Released: 05/01/2008 Document  Revised: 07/02/2016 Document Reviewed: 08/07/2015 Elsevier Interactive Patient Education  [employer].

## 2018-10-22 LAB — PAIN MGMT, PROFILE 8 W/CONF, U
6 ACETYLMORPHINE: NEGATIVE ng/mL (ref <10)
AMPHETAMINE: NEGATIVE ng/mL (ref <250)
Alcohol Metabolites: NEGATIVE ng/mL (ref <500)
Amphetamines: NEGATIVE ng/mL (ref <500)
BUPRENORPHINE, URINE: NEGATIVE ng/mL (ref <5)
Benzodiazepines: NEGATIVE ng/mL (ref <100)
CREATININE: 329.6 mg/dL
Cocaine Metabolite: NEGATIVE ng/mL (ref <150)
MDMA: NEGATIVE ng/mL (ref <500)
Marijuana Metabolite: >500 ng/mL — ABNORMAL HIGH (ref <5)
Marijuana Metabolite: POSITIVE ng/mL — AB (ref <20)
Methamphetamine: NEGATIVE ng/mL (ref <250)
OPIATES: NEGATIVE ng/mL (ref <100)
OXIDANT: NEGATIVE ug/mL (ref <200)
OXYCODONE: NEGATIVE ng/mL (ref <100)
PH: 6.12 (ref 4.5–9.0)

## 2018-11-04 ENCOUNTER — Ambulatory Visit: Payer: Self-pay | Admitting: *Deleted

## 2018-11-04 DIAGNOSIS — J0191 Acute recurrent sinusitis, unspecified: Secondary | ICD-10-CM

## 2018-11-04 MED ORDER — PREDNISONE 20 MG PO TABS: ORAL_TABLET | ORAL | 0 refills | Status: DC

## 2018-11-04 MED ORDER — DOXYCYCLINE HYCLATE 100 MG PO CAPS: 100.0000 mg | ORAL_CAPSULE | Freq: Two times a day (BID) | ORAL | 0 refills | Status: DC

## 2018-11-04 NOTE — Addendum Note (Signed)
Addended by: Lamar Blinks C on: 11/04/2018 05:36 PM   Modules accepted: Orders

## 2018-11-04 NOTE — Telephone Encounter (Addendum)
Called him back - no answer, LMOM  Try him again at 5:30 PM, was able to reach.  He finished the amoxicillin, but did not feel he really helped that much.  He continues to have pain and pressure in his sinuses, and his nose feels quite raw.  Cough is minor, he has not had any fever.  We will treat with a course of doxycycline, and also prednisone for 5 days.  He will let me know if this does not help.  Meds ordered this encounter  Medications  . doxycycline (VIBRAMYCIN) 100 MG capsule    Sig: Take 1 capsule (100 mg total) by mouth 2 (two) times daily.    Dispense:  20 capsule    Refill:  0  . predniSONE (DELTASONE) 20 MG tablet    Sig: Take 2 pills- 40 mg- daily for 5 days    Dispense:  10 tablet    Refill:  0

## 2018-11-04 NOTE — Telephone Encounter (Signed)
Contacted pt regarding symptoms; he was seen in office in office 10/18/18 by Dr Lamar Blinks; per his AVS the pt is calling back because his symptoms have not improved;  he says he has post nasal drip and "rawness of the nose" described as a burning sensation; he also says that he has been taking muccinex to help control mucus; the pt says he has dark/clear nasal secretions mixed with blood; the pt says he is having pressure in his temples; the pt uses [facility] on [address] [place], [place]; he can also be contacted at [phone] and a message can be left; at number; unable to contact flow coordinator at [facility] via phone; will route to office for final disposition.   Reason for Disposition . [1] Taking antibiotic > 72 hours (3 days) AND [2] sinus pain not improved  Answer Assessment - Initial Assessment Questions 1. ANTIBIOTIC: "What antibiotic are you receiving?" "How many times per day?"     Amoxicillin 500 mg 2 tablets 2 times daily  2. ONSET: "When was the antibiotic started?"     10/18/18 3. PAIN: "How bad is the sinus pain?"   (Scale 1-10; mild, moderate or severe)   - MILD (1-3): doesn't interfere with normal activities    - MODERATE (4-7): interferes with normal activities (e.g., work or school) or awakens from sleep   - SEVERE (8-10): excruciating pain and patient unable to do any normal activities    Discomfort rated 8 out of 10 4. FEVER: "Do you have a fever?" If so, ask: "What is it, how was it measured, and when did it start?"    no 5. SYMPTOMS: "Are there any other symptoms you're concerned about?" If so, ask: "When did it start?"    Sinus pressure in forehead/temple; post nasal drip;  6. PREGNANCY: "Is there any chance you are pregnant?" "When was your last menstrual period?"     n/a  Protocols used: SINUS INFECTION ON ANTIBIOTIC FOLLOW-UP CALL-A-AH

## 2018-11-15 ENCOUNTER — Telehealth: Payer: Self-pay | Admitting: Family Medicine

## 2018-11-15 NOTE — Telephone Encounter (Signed)
Copied from [phone]. Topic: Quick Communication - Rx Refill/Question >> Nov 15, 2018  2:20 PM Yvette Rack wrote: Medication: amphetamine-dextroamphetamine (ADDERALL) 20 MG tablet   Has the patient contacted their pharmacy? No. (Agent: If no, request that the patient contact the pharmacy for the refill.) (Agent: If yes, when and what did the pharmacy advise?) pharmacy told pt no need to call because its a control drug  Preferred Pharmacy (with phone number or street name): [facility] [place], [place] - [address] [place] [address] [phone] (Phone) [phone] (Fax)    Agent: Please be advised that RX refills may take up to 3 business days. We ask that you follow-up with your pharmacy.

## 2018-11-15 NOTE — Telephone Encounter (Signed)
Pt will need to contact pharmacy. Refills available at pharmacy.

## 2018-11-16 NOTE — Telephone Encounter (Signed)
Patient called and advised he has refills of Adderall at [facility] that was sent on 10/18/18, he verbalized understanding. I advised to call the office back if the refill is not there.

## 2018-12-20 ENCOUNTER — Other Ambulatory Visit: Payer: Self-pay | Admitting: Family Medicine

## 2018-12-20 DIAGNOSIS — J0191 Acute recurrent sinusitis, unspecified: Secondary | ICD-10-CM

## 2018-12-22 NOTE — Telephone Encounter (Signed)
Called him as I received a refill request for prednisone.  He has noted rawness in his nose, sneezing, a raw feeling in his chest  No fever He feels like he is actually getting better Recommended an OTC nasal steroid for his nasal sx Will hold off on refilling prednisone for now Asked him to contact me with any needs or concerns

## 2018-12-28 ENCOUNTER — Other Ambulatory Visit: Payer: Self-pay | Admitting: Family Medicine

## 2018-12-28 DIAGNOSIS — F988 Other specified behavioral and emotional disorders with onset usually occurring in childhood and adolescence: Secondary | ICD-10-CM

## 2018-12-29 MED ORDER — AMPHETAMINE-DEXTROAMPHETAMINE 20 MG PO TABS: 20.0000 mg | ORAL_TABLET | Freq: Three times a day (TID) | ORAL | 0 refills | Status: DC

## 2018-12-29 NOTE — Telephone Encounter (Signed)
Last visit here was in December UDS is up-to-date  11/29/2018  3   10/18/2018  Dextroamp-Amphetamin 20 MG Tab  90.00 30 Je Cop   [id number]   Wal ([id number])   0   Comm Ins   [place]  10/18/2018  3   10/18/2018  Dextroamp-Amphetamin 20 MG Tab  90.00 30 Je Cop   [id number]   Wal ([id number])   0   Comm Ins   [place]  09/17/2018  3   09/17/2018  Dextroamp-Amphetamin 20 MG Tab  90.00 30 Je Cop   [id number]   Wal ([id number])   0   Comm Ins   [place]  08/12/2018  3   06/07/2018  Dextroamp-Amphetamin 20 MG Tab  90.00 30 Je Cop   [id number]   Wal ([id number])   0   Comm Ins   [place]  07/08/2018  3   06/07/2018  Dextroamp-Amphetamin 20 MG Tab  90.00 30 Je Cop   [id number]   Wal ([id number])   0   Comm Ins   [place]  06/07/2018  3   06/07/2018  Dextroamp-Amphetamin 20 MG Tab  90.00 30 Je Cop   [id number]   Wal ([id number])   0   Comm Ins   [place]  05/16/2018  3   05/16/2018  Oxycodone-Acetaminophen 5-325  4.00 1 Hi Kha   [id number]   Wal ([id number])   0  30.00 MME  Comm Ins   [place]  05/01/2018  3   01/08/2018  Dextroamp-Amphetamin 20 MG Tab  90.00 30 Je Cop   [id number]   Wal (2191)   0   Comm Ins   [place]    Okay to refill

## 2019-01-27 ENCOUNTER — Other Ambulatory Visit: Payer: Self-pay | Admitting: Family Medicine

## 2019-01-27 ENCOUNTER — Encounter: Payer: Self-pay | Admitting: Family Medicine

## 2019-01-27 DIAGNOSIS — J0191 Acute recurrent sinusitis, unspecified: Secondary | ICD-10-CM

## 2019-01-27 DIAGNOSIS — F988 Other specified behavioral and emotional disorders with onset usually occurring in childhood and adolescence: Secondary | ICD-10-CM

## 2019-01-28 MED ORDER — AMPHETAMINE-DEXTROAMPHETAMINE 20 MG PO TABS: 20.0000 mg | ORAL_TABLET | Freq: Three times a day (TID) | ORAL | 0 refills | Status: DC

## 2019-01-28 MED ORDER — DOXYCYCLINE HYCLATE 100 MG PO CAPS: 100.0000 mg | ORAL_CAPSULE | Freq: Two times a day (BID) | ORAL | 0 refills | Status: DC

## 2019-01-28 NOTE — Telephone Encounter (Signed)
Reviewed [facility]:  12/29/2018  3   12/29/2018  Dextroamp-Amphetamin 20 MG Tab  90.00 30 Je Cop   [id number]   Wal ([id number])   0   Comm Ins   [place]  11/29/2018  3   10/18/2018  Dextroamp-Amphetamin 20 MG Tab  90.00 30 Je Cop   [id number]   Wal ([id number])   0   Comm Ins   [place]  10/18/2018  3   10/18/2018  Dextroamp-Amphetamin 20 MG Tab  90.00 30 Je Cop   [id number]   Wal ([id number])   0   Comm Ins   [place]  09/17/2018  3   09/17/2018  Dextroamp-Amphetamin 20 MG Tab  90.00 30 Je Cop   [id number]   Wal ([id number])   0   Comm Ins   [place]  08/12/2018  3   06/07/2018  Dextroamp-Amphetamin 20 MG Tab  90.00 30 Je Cop   [id number]   Wal ([id number])   0   Comm Ins   [place]  07/08/2018  3   06/07/2018  Dextroamp-Amphetamin 20 MG Tab  90.00 30 Je Cop   [id number]   Wal (8151)   0   Comm Ins   [place]  06/07/2018  3   06/07/2018  Dextroamp-Amphetamin 20 MG Tab  90.00 30 Je Cop   [id number]   Wal ([id number])   0   Comm Ins   [place]  05/16/2018  3   05/16/2018  Oxycodone-Acetaminophen 5-325  4.00 1 Hi Kha   [id number]   Wal ([id number])   0  30.00 MME  Comm Ins   [place]  05/01/2018  3   01/08/2018  Dextroamp-Amphetamin 20 MG Tab  90.00 30 Je Cop   [id number]   Wal (2191)   0   Comm Ins   [place]  03/29/2018  3   03/29/2018  Dextroamp-Amphetamin 20 MG Tab  90.00 30 Je Cop   [id number]   Wal (2191)   0   Comm Ins   [place]  02/23/2018  3   01/08/2018  Dextroamp-Amphetamin 20 MG Tab  90.00 30 Je Cop   [id number]   Wal (2191)   0   Comm Ins   [place]  UDS is UTD

## 2019-02-03 ENCOUNTER — Other Ambulatory Visit: Payer: Self-pay | Admitting: Family Medicine

## 2019-02-03 DIAGNOSIS — E7800 Pure hypercholesterolemia, unspecified: Secondary | ICD-10-CM

## 2019-02-28 ENCOUNTER — Other Ambulatory Visit: Payer: Self-pay | Admitting: Family Medicine

## 2019-02-28 DIAGNOSIS — F988 Other specified behavioral and emotional disorders with onset usually occurring in childhood and adolescence: Secondary | ICD-10-CM

## 2019-02-28 DIAGNOSIS — E7800 Pure hypercholesterolemia, unspecified: Secondary | ICD-10-CM

## 2019-02-28 MED ORDER — AMPHETAMINE-DEXTROAMPHETAMINE 20 MG PO TABS: 20.0000 mg | ORAL_TABLET | Freq: Three times a day (TID) | ORAL | 0 refills | Status: DC

## 2019-02-28 MED ORDER — PRAVASTATIN SODIUM 40 MG PO TABS: 40.0000 mg | ORAL_TABLET | Freq: Every day | ORAL | 3 refills | Status: DC

## 2019-02-28 NOTE — Telephone Encounter (Signed)
Office visit is UTD UDS-  UTD  01/28/2019  3   01/28/2019  Dextroamp-Amphetamin 20 MG Tab  90.00 30 Je Cop   [id number]   Wal ([id number])   0   Comm Ins   [place]  12/29/2018  3   12/29/2018  Dextroamp-Amphetamin 20 MG Tab  90.00 30 Je Cop   [id number]   Wal ([id number])   0   Comm Ins   [place]  11/29/2018  3   10/18/2018  Dextroamp-Amphetamin 20 MG Tab  90.00 30 Je Cop   [id number]   Wal (8151)   0   Comm Ins   [place]  10/18/2018  3   10/18/2018  Dextroamp-Amphetamin 20 MG Tab  90.00 30 Je Cop   [id number]   Wal ([id number])   0   Comm Ins   [place]  09/17/2018  3   09/17/2018  Dextroamp-Amphetamin 20 MG Tab  90.00 30 Je Cop   [id number]   Wal ([id number])   0   Comm Ins   [place]  08/12/2018  3   06/07/2018  Dextroamp-Amphetamin 20 MG Tab  90.00 30 Je Cop   [id number]   Wal ([id number])   0   Comm Ins   [place]

## 2019-03-17 ENCOUNTER — Encounter: Payer: Self-pay | Admitting: Family Medicine

## 2019-03-28 ENCOUNTER — Ambulatory Visit: Payer: BLUE CROSS/BLUE SHIELD | Admitting: Family Medicine

## 2019-03-28 ENCOUNTER — Other Ambulatory Visit: Payer: Self-pay

## 2019-03-28 NOTE — Progress Notes (Signed)
No show

## 2019-03-29 ENCOUNTER — Other Ambulatory Visit: Payer: Self-pay | Admitting: Family Medicine

## 2019-03-29 DIAGNOSIS — F988 Other specified behavioral and emotional disorders with onset usually occurring in childhood and adolescence: Secondary | ICD-10-CM

## 2019-03-30 ENCOUNTER — Ambulatory Visit (INDEPENDENT_AMBULATORY_CARE_PROVIDER_SITE_OTHER): Payer: BLUE CROSS/BLUE SHIELD | Admitting: Family Medicine

## 2019-03-30 ENCOUNTER — Encounter: Payer: Self-pay | Admitting: Family Medicine

## 2019-03-30 ENCOUNTER — Other Ambulatory Visit: Payer: Self-pay

## 2019-03-30 DIAGNOSIS — J310 Chronic rhinitis: Secondary | ICD-10-CM

## 2019-03-30 MED ORDER — PREDNISONE 20 MG PO TABS: ORAL_TABLET | ORAL | 0 refills | Status: DC

## 2019-03-30 MED ORDER — MONTELUKAST SODIUM 10 MG PO TABS: 10.0000 mg | ORAL_TABLET | Freq: Every day | ORAL | 5 refills | Status: DC

## 2019-03-30 NOTE — Progress Notes (Signed)
[place] at [facility] [address], [place], [place] [address] [phone]  Date:  03/30/2019   Name:  Craig Jordan.   DOB:  09/09/1971   MRN:  [id number]  PCP:  Darreld Mclean, MD    Chief Complaint: No chief complaint on file.   History of Present Illness:  Craig Jordan. is a 48 y.o. 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 Pt is in his car Provider is in office Pt ID confirmed with name and DOB, he gives consent for virtual visit today Patient with history of ADHD He was a no-show for visit yesterday to discuss sinusitis-I think he forgot about his visit I had given him a course of doxycycline in March for presumed acute sinusitis  He was also treated with antibiotics twice in December for the same problem, first with amoxicillin and then with doxycycline I also gave him 5 days of prednisone in [place] is not sure this really helped  He feels like he never gets better even with antibiotics His nose feels raw and uncomfortable He occasionally has a nose bleed He feels like he has PND, and wants to cough up mucus sometimes from his chest.  However he does not otherwise have a cough No fever He did have diarrhea a week ago, resolved after about 2 days No vomiting  He is not using nasal sprays right now He is propping up at night to breathe  He sometimes has some pain and pressure in his sinuses, but not often It is more than his nasal passages feel raw.  He does have history of glaucoma, managed by [facility].  He is on Travatan drops  His wife has noted some stomach problems, but nothing related to his sx    Patient Active Problem List   Diagnosis Date Noted  . Pure hypercholesterolemia 08/29/2016  . Glaucoma 02/05/2015  . Snoring 02/11/2013  . Chest pain, atypical 12/20/2012  .  ADD (attention deficit disorder) 12/13/2012  . Obesity 12/13/2012    Past Medical History:  Diagnosis Date  . ADHD (attention deficit hyperactivity disorder)   . Allergy   . Glaucoma   . Hyperlipidemia   . Obesity     Past Surgical History:  Procedure Laterality Date  . KNEE SURGERY     arthroscopic -  B meniscus repairs    Social History   Tobacco Use  . Smoking status: Former Smoker    Last attempt to quit: 05/17/1994    Years since quitting: 24.8  . Smokeless tobacco: Never Used  Substance Use Topics  . Alcohol use: No    Alcohol/week: 0.0 standard drinks    Comment: "Stopped 2 weeks ago" stated on 07/28/2014  . Drug use: Yes    Frequency: 7.0 times per week    Types: Marijuana    Family History  Problem Relation Age of Onset  . Seizures Father   . Epilepsy Father   . Arthritis Father        hip OA s/p hip replacement  . Sleep apnea Brother     No Known Allergies  Medication list has been reviewed and updated.  Current Outpatient Medications on File Prior to Visit  Medication Sig Dispense Refill  . amphetamine-dextroamphetamine (ADDERALL) 20 MG tablet Take 1 tablet (20 mg total) by mouth 3 (three) times  daily. 90 tablet 0  . amphetamine-dextroamphetamine (ADDERALL) 20 MG tablet Take 1 tablet (20 mg total) by mouth 3 (three) times daily. 90 tablet 0  . amphetamine-dextroamphetamine (ADDERALL) 20 MG tablet Take 1 tablet (20 mg total) by mouth 3 (three) times daily. Ok to fill in 30 days 90 tablet 0  . doxycycline (VIBRAMYCIN) 100 MG capsule Take 1 capsule (100 mg total) by mouth 2 (two) times daily. 20 capsule 0  . naproxen (NAPROSYN) 500 MG tablet Take 1 tablet (500 mg total) by mouth 2 (two) times daily. (Patient not taking: Reported on 10/18/2018) 30 tablet 0  . oxymetazoline (AFRIN) 0.05 % nasal spray Place 2 sprays into the nose at bedtime as needed. For nasal congestion    . pravastatin (PRAVACHOL) 40 MG tablet Take 1 tablet (40 mg total) by mouth daily. 90  tablet 3  . predniSONE (DELTASONE) 20 MG tablet Take 2 pills- 40 mg- daily for 5 days 10 tablet 0  . Travoprost, BAK Free, (TRAVATAN) 0.004 % SOLN ophthalmic solution Place 1 drop into both eyes at bedtime.     No current facility-administered medications on file prior to visit.     Review of Systems:  As per HPI- otherwise negative. No fever chills  Physical Examination: There were no vitals filed for this visit. There were no vitals filed for this visit. There is no height or weight on file to calculate BMI. Ideal Body Weight:    Patient observed over video today.  He looks well, his normal self.  No cough, wheezing, tachypnea is observed  Assessment and Plan: Rhinitis, unspecified type - Plan: montelukast (SINGULAIR) 10 MG tablet, predniSONE (DELTASONE) 20 MG tablet  Concern of persistent rhinitis today.  We have treated him 3 times now for sinus infection with no success.  I tend to think this is not bacterial.  May be allergic, will try prednisone again and add Singulair. I would like to try Atrovent nasal, but need to confirm that this is okay with his glaucoma history. His ophthalmologist is Dr. Sabra Heck at [facility]-I called them but they are closed for the day.  I will try them again tomorrow  Patient states understanding and agreement with plan Meds ordered this encounter  Medications  . montelukast (SINGULAIR) 10 MG tablet    Sig: Take 1 tablet (10 mg total) by mouth at bedtime. Use as needed for nasal symptoms    Dispense:  30 tablet    Refill:  5  . predniSONE (DELTASONE) 20 MG tablet    Sig: Take 2 pills a day for 3 days, then 1 pill a day for 3 days    Dispense:  9 tablet    Refill:  0    Signed Lamar Blinks, MD

## 2019-03-31 ENCOUNTER — Encounter: Payer: Self-pay | Admitting: Family Medicine

## 2019-03-31 NOTE — Telephone Encounter (Signed)
Pt of Dr. Donato Heinz Called his eye doctor to ask about his type of glaucoma  He has OPEN glaucoma, can use atrovent nasal if he likes Message to pt

## 2019-03-31 NOTE — Telephone Encounter (Signed)
-----   Message from Darreld Mclean, MD sent at 03/30/2019  4:02 PM EDT ----- Regarding: Call Sabra Heck vision between 10 and 2

## 2019-04-14 DIAGNOSIS — M25562 Pain in left knee: Secondary | ICD-10-CM | POA: Diagnosis not present

## 2019-05-05 ENCOUNTER — Encounter: Payer: Self-pay | Admitting: Family Medicine

## 2019-05-05 DIAGNOSIS — F988 Other specified behavioral and emotional disorders with onset usually occurring in childhood and adolescence: Secondary | ICD-10-CM

## 2019-05-06 MED ORDER — AMPHETAMINE-DEXTROAMPHETAMINE 20 MG PO TABS: 20.0000 mg | ORAL_TABLET | Freq: Three times a day (TID) | ORAL | 0 refills | Status: DC

## 2019-07-18 ENCOUNTER — Encounter: Payer: Self-pay | Admitting: Family Medicine

## 2019-07-18 DIAGNOSIS — F988 Other specified behavioral and emotional disorders with onset usually occurring in childhood and adolescence: Secondary | ICD-10-CM

## 2019-07-18 MED ORDER — AMPHETAMINE-DEXTROAMPHETAMINE 20 MG PO TABS: 20.0000 mg | ORAL_TABLET | Freq: Three times a day (TID) | ORAL | 0 refills | Status: DC

## 2019-07-19 ENCOUNTER — Other Ambulatory Visit: Payer: Self-pay | Admitting: Family Medicine

## 2019-07-19 MED ORDER — PENICILLIN V POTASSIUM 500 MG PO TABS: 500.0000 mg | ORAL_TABLET | Freq: Two times a day (BID) | ORAL | 0 refills | Status: DC

## 2019-07-21 NOTE — Patient Instructions (Addendum)
It was great to see you again today-take care Please see me in about 6 months I will be in touch with your labs asap  Flu shot given today

## 2019-07-21 NOTE — Progress Notes (Addendum)
[place] at [facility] [address] [place], [place] [address] [place]  Date:  08/01/2019   Name:  Craig Jordan.   DOB:  [birth year]   MRN:  [id number]  PCP:  Darreld Mclean, MD    Chief Complaint: Medication Refill (recheck nasal infection) and Flu Vaccine   History of Present Illness:  Craig Wah. is a 48 y.o. very pleasant male patient who presents with the following:  Here for routine follow-up visit and to discuss medications History of obesity, hyperlipidemia, glaucoma, snoring, ADD  Treated with Pravachol for lipids Adderall 20 3 times daily- doing ok with current dosage, he can eat and sleep ok, no SE noted.  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 Married to [employer]- she is doing well Most recent labs in December 2019-may wish to repeat today due to pandemic Flu shot today He uses singulair prn- this helps some with nasal allergy symptoms He does use afrin at bedtime as needed for congestion, but tries to minimize this medication  No chest pain or shortness of breath He has gained some weight, plans to work on this [facility] Readings from Last 3 Encounters:  08/01/19 (!) 322 lb (146.1 kg)  10/18/18 (!) 321 lb (145.6 kg)  06/07/18 (!) 324 lb (147 kg)      06/16/2019  3   05/06/2019  Dextroamp-Amphetamin 20 MG Tab  90.00 30 Je Cop   [id number]   Wal ([id number])   0   Comm Ins   [place]  05/06/2019  3   05/06/2019  Dextroamp-Amphetamin 20 MG Tab  90.00 30 Je Cop   [id number]   Wal ([id number])   0   Comm Ins   [place]  04/02/2019  3   02/28/2019  Dextroamp-Amphetamin 20 MG Tab  90.00 30 Je Cop   [id number]   Wal ([id number])   0   Comm Ins   [place]  02/28/2019  3   02/28/2019  Dextroamp-Amphetamin 20 MG Tab  90.00 30 Je Cop   [id number]   Wal ([id number])   0   Comm Ins   [place]   01/28/2019  3   01/28/2019  Dextroamp-Amphetamin 20 MG Tab  90.00 30 Je Cop   [id number]   Wal ([id number])   0   Comm Ins   [place]  12/29/2018  3   12/29/2018  Dextroamp-Amphetamin 20 MG Tab  90.00 30 Je Cop   [id number]   Wal ([id number])   0   Comm Ins   [place]  11/29/2018  3   10/18/2018  Dextroamp-Amphetamin 20 MG Tab  90.00 30 Je Cop   [id number]   Wal ([id number])   0   Comm Ins   [place]  10/18/2018  3   10/18/2018  Dextroamp-Amphetamin 20 MG Tab  90.00 30 Je Cop   [id number]   Wal ([id number])   0   Comm Ins   [place]  09/17/2018  3   09/17/2018  Dextroamp-Amphetamin 20 MG Tab  90.00 30 Je Cop   [id number]   Wal ([id number])   0        Patient Active Problem List   Diagnosis Date Noted  . Pure hypercholesterolemia 08/29/2016  . Glaucoma 02/05/2015  . Snoring 02/11/2013  .  Chest pain, atypical 12/20/2012  . ADD (attention deficit disorder) 12/13/2012  . Obesity 12/13/2012    Past Medical History:  Diagnosis Date  . ADHD (attention deficit hyperactivity disorder)   . Allergy   . Glaucoma   . Hyperlipidemia   . Obesity     Past Surgical History:  Procedure Laterality Date  . KNEE SURGERY     arthroscopic -  B meniscus repairs    Social History   Tobacco Use  . Smoking status: Former Smoker    Quit date: 05/17/1994    Years since quitting: 25.2  . Smokeless tobacco: Never Used  Substance Use Topics  . Alcohol use: No    Alcohol/week: 0.0 standard drinks    Comment: "Stopped 2 weeks ago" stated on 07/28/2014  . Drug use: Yes    Frequency: 7.0 times per week    Types: Marijuana    Family History  Problem Relation Age of Onset  . Seizures Father   . Epilepsy Father   . Arthritis Father        hip OA s/p hip replacement  . Sleep apnea Brother     No Known Allergies  Medication list has been reviewed and updated.  Current Outpatient Medications on File Prior to Visit  Medication Sig Dispense Refill  . amphetamine-dextroamphetamine (ADDERALL) 20 MG tablet Take 1 tablet (20 mg total) by mouth 3 (three) times daily. 90  tablet 0  . amphetamine-dextroamphetamine (ADDERALL) 20 MG tablet Take 1 tablet (20 mg total) by mouth 3 (three) times daily. Ok to fill in 30 days 90 tablet 0  . amphetamine-dextroamphetamine (ADDERALL) 20 MG tablet Take 1 tablet (20 mg total) by mouth 3 (three) times daily. 90 tablet 0  . montelukast (SINGULAIR) 10 MG tablet Take 1 tablet (10 mg total) by mouth at bedtime. Use as needed for nasal symptoms 30 tablet 5  . oxymetazoline (AFRIN) 0.05 % nasal spray Place 2 sprays into the nose at bedtime as needed. For nasal congestion    . pravastatin (PRAVACHOL) 40 MG tablet Take 1 tablet (40 mg total) by mouth daily. 90 tablet 3  . latanoprost (XALATAN) 0.005 % ophthalmic solution      No current facility-administered medications on file prior to visit.     Review of Systems:  As per HPI- otherwise negative. No fever or chills  Physical Examination: Vitals:   08/01/19 1117  BP: 128/80  Pulse: 86  Resp: 16  Temp: (!) 96.2 F (35.7 C)  SpO2: 98%   Vitals:   08/01/19 1117  Weight: (!) 322 lb (146.1 kg)  Height: 5' 11"  (1.803 m)   Body mass index is 44.91 kg/m. Ideal Body Weight: Weight in (lb) to have BMI = 25: 178.9  GEN: WDWN, NAD, Non-toxic, A & O x 3, obese, looks well HEENT: Atraumatic, Normocephalic. Neck supple. No masses, No LAD. Bilateral TM wnl, oropharynx normal.  PEERL,EOMI. mild inflammation of the nasal cavity is present, more so on the right Ears and Nose: No external deformity. CV: RRR, No M/G/R. No JVD. No thrill. No extra heart sounds. PULM: CTA B, no wheezes, crackles, rhonchi. No retractions. No resp. distress. No accessory muscle use. ABD: S, NT, ND, +BS. No rebound. No HSM. EXTR: No c/c/e NEURO Normal gait.  PSYCH: Normally interactive. Conversant. Not depressed or anxious appearing.  Calm demeanor.    Assessment and Plan: Attention deficit disorder (ADD) without hyperactivity  Hyperlipidemia, unspecified hyperlipidemia type - Plan: Lipid  panel  Class 3 severe obesity  due to excess calories with serious comorbidity and body mass index (BMI) of 40.0 to 44.9 in adult [facility])  Screening for diabetes mellitus - Plan: Comprehensive metabolic panel, Hemoglobin A1c  Screening for deficiency anemia - Plan: CBC  Immunization due  Screening for prostate cancer - Plan: PSA  Following up today ADD is under control with current adderall regiemn-will refill when due Encourage him to work on his weight with diet and exercise Labs pending as above -PSA due to positive family history of prostate cancer Flu shot given  Signed Lamar Blinks, MD  Received his labs, message to patient Results for orders placed or performed in visit on 08/01/19  CBC  Result Value Ref Range   WBC 6.4 4.0 - 10.5 K/uL   RBC 5.10 4.22 - 5.81 Mil/uL   Platelets 249.0 150.0 - 400.0 K/uL   Hemoglobin 14.9 13.0 - 17.0 g/dL   HCT 44.6 39.0 - 52.0 %   MCV 87.5 78.0 - 100.0 fl   MCHC 33.4 30.0 - 36.0 g/dL   RDW 13.0 11.5 - 15.5 %  Comprehensive metabolic panel  Result Value Ref Range   Sodium 138 135 - 145 mEq/L   Potassium 4.5 3.5 - 5.1 mEq/L   Chloride 103 96 - 112 mEq/L   CO2 28 19 - 32 mEq/L   Glucose, Bld 93 70 - 99 mg/dL   BUN 15 6 - 23 mg/dL   Creatinine, Ser 1.03 0.40 - 1.50 mg/dL   Total Bilirubin 0.4 0.2 - 1.2 mg/dL   Alkaline Phosphatase 79 39 - 117 U/L   AST 18 0 - 37 U/L   ALT 23 0 - 53 U/L   Total Protein 6.7 6.0 - 8.3 g/dL   Albumin 4.2 3.5 - 5.2 g/dL   Calcium 9.6 8.4 - 10.5 mg/dL   GFR 77.48 >60.00 mL/min  Hemoglobin A1c  Result Value Ref Range   Hgb A1c MFr Bld 5.8 4.6 - 6.5 %  Lipid panel  Result Value Ref Range   Cholesterol 191 0 - 200 mg/dL   Triglycerides 96.0 0.0 - 149.0 mg/dL   HDL 57.80 >39.00 mg/dL   VLDL 19.2 0.0 - 40.0 mg/dL   LDL Cholesterol 114 (H) 0 - 99 mg/dL   Total CHOL/HDL Ratio 3    NonHDL 133.29   PSA  Result Value Ref Range   PSA 0.63 0.10 - 4.00 ng/mL

## 2019-08-01 ENCOUNTER — Ambulatory Visit (INDEPENDENT_AMBULATORY_CARE_PROVIDER_SITE_OTHER): Payer: BC Managed Care – PPO | Admitting: Family Medicine

## 2019-08-01 ENCOUNTER — Encounter: Payer: Self-pay | Admitting: Family Medicine

## 2019-08-01 ENCOUNTER — Other Ambulatory Visit: Payer: Self-pay

## 2019-08-01 VITALS — BP 128/80 | HR 86 | Temp 96.2°F | Resp 16 | Ht 71.0 in | Wt 322.0 lb

## 2019-08-01 DIAGNOSIS — Z130 Encounter for screening for diseases of the blood and blood-forming organs and certain disorders involving the immune mechanism: Secondary | ICD-10-CM | POA: Diagnosis not present

## 2019-08-01 DIAGNOSIS — Z125 Encounter for screening for malignant neoplasm of prostate: Secondary | ICD-10-CM

## 2019-08-01 DIAGNOSIS — E785 Hyperlipidemia, unspecified: Secondary | ICD-10-CM

## 2019-08-01 DIAGNOSIS — Z131 Encounter for screening for diabetes mellitus: Secondary | ICD-10-CM | POA: Diagnosis not present

## 2019-08-01 DIAGNOSIS — F988 Other specified behavioral and emotional disorders with onset usually occurring in childhood and adolescence: Secondary | ICD-10-CM

## 2019-08-01 DIAGNOSIS — Z23 Encounter for immunization: Secondary | ICD-10-CM

## 2019-08-01 DIAGNOSIS — Z6841 Body Mass Index (BMI) 40.0 and over, adult: Secondary | ICD-10-CM

## 2019-08-01 LAB — CBC
HCT: 44.6 % (ref 39.0–52.0)
Hemoglobin: 14.9 g/dL (ref 13.0–17.0)
MCHC: 33.4 g/dL (ref 30.0–36.0)
MCV: 87.5 fl (ref 78.0–100.0)
Platelets: 249 10*3/uL (ref 150.0–400.0)
RBC: 5.1 Mil/uL (ref 4.22–5.81)
RDW: 13 % (ref 11.5–15.5)
WBC: 6.4 10*3/uL (ref 4.0–10.5)

## 2019-08-01 LAB — COMPREHENSIVE METABOLIC PANEL
ALT: 23 U/L (ref 0–53)
AST: 18 U/L (ref 0–37)
Albumin: 4.2 g/dL (ref 3.5–5.2)
Alkaline Phosphatase: 79 U/L (ref 39–117)
BUN: 15 mg/dL (ref 6–23)
CO2: 28 mEq/L (ref 19–32)
Calcium: 9.6 mg/dL (ref 8.4–10.5)
Chloride: 103 mEq/L (ref 96–112)
Creatinine, Ser: 1.03 mg/dL (ref 0.40–1.50)
GFR: 77.48 mL/min (ref >60.00)
Glucose, Bld: 93 mg/dL (ref 70–99)
Potassium: 4.5 mEq/L (ref 3.5–5.1)
Sodium: 138 mEq/L (ref 135–145)
Total Bilirubin: 0.4 mg/dL (ref 0.2–1.2)
Total Protein: 6.7 g/dL (ref 6.0–8.3)

## 2019-08-01 LAB — LIPID PANEL
Cholesterol: 191 mg/dL (ref 0–200)
HDL: 57.8 mg/dL (ref >39.00)
LDL Cholesterol: 114 mg/dL — ABNORMAL HIGH (ref 0–99)
NonHDL: 133.29
Total CHOL/HDL Ratio: 3
Triglycerides: 96 mg/dL (ref 0.0–149.0)
VLDL: 19.2 mg/dL (ref 0.0–40.0)

## 2019-08-01 LAB — HEMOGLOBIN A1C: Hgb A1c MFr Bld: 5.8 % (ref 4.6–6.5)

## 2019-08-01 LAB — PSA: PSA: 0.63 ng/mL (ref 0.10–4.00)

## 2019-08-04 DIAGNOSIS — H401131 Primary open-angle glaucoma, bilateral, mild stage: Secondary | ICD-10-CM | POA: Diagnosis not present

## 2019-08-22 ENCOUNTER — Encounter: Payer: Self-pay | Admitting: Family Medicine

## 2019-08-23 ENCOUNTER — Encounter: Payer: Self-pay | Admitting: Family Medicine

## 2019-08-23 NOTE — Telephone Encounter (Signed)
Requesting:Adderall  Contract:none, needs csc UDS:10/18/18  Last Visit:08/01/2019 Next Visit:none Last Refill:07/18/2019 3 rxs shouldn't patient have an extra refill at pharmacy ?   Please Advise

## 2019-09-03 ENCOUNTER — Encounter: Payer: Self-pay | Admitting: Family Medicine

## 2019-09-04 MED ORDER — PENICILLIN V POTASSIUM 500 MG PO TABS: 500.0000 mg | ORAL_TABLET | Freq: Two times a day (BID) | ORAL | 0 refills | Status: DC

## 2019-09-05 ENCOUNTER — Other Ambulatory Visit: Payer: Self-pay

## 2019-09-05 ENCOUNTER — Encounter: Payer: Self-pay | Admitting: Family Medicine

## 2019-09-05 ENCOUNTER — Ambulatory Visit (INDEPENDENT_AMBULATORY_CARE_PROVIDER_SITE_OTHER): Payer: BC Managed Care – PPO | Admitting: Family Medicine

## 2019-09-05 DIAGNOSIS — J029 Acute pharyngitis, unspecified: Secondary | ICD-10-CM

## 2019-09-05 NOTE — Progress Notes (Signed)
[place] at [facility] [address], [place], [place] [address] [phone] [id number]  Date:  09/05/2019   Name:  Craig Jordan.   DOB:  11-Jun-1971   MRN:  [id number]  PCP:  Darreld Mclean, MD    Chief Complaint: No chief complaint on file.   History of Present Illness:  Craig Jordan. is a 48 y.o. very pleasant male patient who presents with the following:  Patient with history of ADD, hyperlipidemia, glaucoma, obesity Virtual visit today for sore throat- first noted about 2 days ago with swollen glands and ST Patient location is home, provider is at office Pt ID confirmed with 2 factors, he gives consent for virtual visit today   He feels better today but he got a lot of rest last night His wife has noted a low grade fever He is using a a mucinex product He looked down his throat and did not see anything on his tonsils  I started him on penicillin yesterday for concern of strep- he did not actually pick this up yet   He did notice a mild cough- ?PND He is feeling a bit fatigued but his adderall does help  No GI symptoms  No distress, he is still working     Patient Active Problem List   Diagnosis Date Noted  . Pure hypercholesterolemia 08/29/2016  . Glaucoma 02/05/2015  . Snoring 02/11/2013  . Chest pain, atypical 12/20/2012  . ADD (attention deficit disorder) 12/13/2012  . Obesity 12/13/2012    Past Medical History:  Diagnosis Date  . ADHD (attention deficit hyperactivity disorder)   . Allergy   . Glaucoma   . Hyperlipidemia   . Obesity     Past Surgical History:  Procedure Laterality Date  . KNEE SURGERY     arthroscopic -  B meniscus repairs    Social History   Tobacco Use  . Smoking status: Former Smoker    Quit date: 05/17/1994    Years since quitting: 25.3  . Smokeless tobacco: Never Used  Substance Use Topics  . Alcohol use: No    Alcohol/week: 0.0 standard drinks    Comment: "Stopped  2 weeks ago" stated on 07/28/2014  . Drug use: Yes    Frequency: 7.0 times per week    Types: Marijuana    Family History  Problem Relation Age of Onset  . Seizures Father   . Epilepsy Father   . Arthritis Father        hip OA s/p hip replacement  . Sleep apnea Brother     No Known Allergies  Medication list has been reviewed and updated.  Current Outpatient Medications on File Prior to Visit  Medication Sig Dispense Refill  . amphetamine-dextroamphetamine (ADDERALL) 20 MG tablet Take 1 tablet (20 mg total) by mouth 3 (three) times daily. 90 tablet 0  . amphetamine-dextroamphetamine (ADDERALL) 20 MG tablet Take 1 tablet (20 mg total) by mouth 3 (three) times daily. Ok to fill in 30 days 90 tablet 0  . amphetamine-dextroamphetamine (ADDERALL) 20 MG tablet Take 1 tablet (20 mg total) by mouth 3 (three) times daily. 90 tablet 0  . latanoprost (XALATAN) 0.005 % ophthalmic solution     . montelukast (SINGULAIR) 10 MG tablet Take 1 tablet (10 mg total) by mouth at bedtime. Use as needed for nasal symptoms 30 tablet 5  . oxymetazoline (AFRIN) 0.05 % nasal spray Place 2 sprays into the nose at  bedtime as needed. For nasal congestion    . penicillin v potassium (VEETID) 500 MG tablet Take 1 tablet (500 mg total) by mouth 2 (two) times daily. 20 tablet 0  . pravastatin (PRAVACHOL) 40 MG tablet Take 1 tablet (40 mg total) by mouth daily. 90 tablet 3   No current facility-administered medications on file prior to visit.     Review of Systems:  As per HPI- otherwise negative.   Physical Examination: There were no vitals filed for this visit. There were no vitals filed for this visit. There is no height or weight on file to calculate BMI. Ideal Body Weight:    Pt observed over video- looks well  No cough, sob or distress is noted   Assessment and Plan: Sore throat - Plan: Novel Coronavirus, NAA ([facility])  Pt contacted me yesterday with concern of strep- I sent in an rx for  penicillin However his sx are not classic strep, consider covid 19.  Ordered testing and explained testing process, he will self isolate until results return  Ordered test for his wife as well  Will plan further follow- up pending labs. He will let me know if any worsening or any other questions   Signed Lamar Blinks, MD

## 2019-09-06 ENCOUNTER — Other Ambulatory Visit: Payer: Self-pay

## 2019-09-06 DIAGNOSIS — Z20822 Contact with and (suspected) exposure to covid-19: Secondary | ICD-10-CM

## 2019-09-06 DIAGNOSIS — Z20828 Contact with and (suspected) exposure to other viral communicable diseases: Secondary | ICD-10-CM | POA: Diagnosis not present

## 2019-09-07 LAB — NOVEL CORONAVIRUS, NAA: SARS-CoV-2, NAA: NOT DETECTED

## 2019-10-19 ENCOUNTER — Encounter: Payer: Self-pay | Admitting: Family Medicine

## 2019-10-19 DIAGNOSIS — F988 Other specified behavioral and emotional disorders with onset usually occurring in childhood and adolescence: Secondary | ICD-10-CM

## 2019-10-19 MED ORDER — AMPHETAMINE-DEXTROAMPHETAMINE 20 MG PO TABS: 20.0000 mg | ORAL_TABLET | Freq: Three times a day (TID) | ORAL | 0 refills | Status: DC

## 2019-10-19 NOTE — Telephone Encounter (Signed)
Last Adderall RX:  07/18/19 (3 RXs) Last OV: 09/05/19 Next OV: none scheduled UDS: 10/18/18, past due CSC: not on file

## 2019-11-04 ENCOUNTER — Encounter: Payer: Self-pay | Admitting: Family Medicine

## 2019-11-05 ENCOUNTER — Telehealth: Payer: BC Managed Care – PPO

## 2019-11-08 ENCOUNTER — Other Ambulatory Visit: Payer: Self-pay | Admitting: Family Medicine

## 2019-11-08 DIAGNOSIS — E7800 Pure hypercholesterolemia, unspecified: Secondary | ICD-10-CM

## 2019-11-16 ENCOUNTER — Encounter: Payer: Self-pay | Admitting: Family Medicine

## 2019-12-18 ENCOUNTER — Encounter: Payer: Self-pay | Admitting: Family Medicine

## 2019-12-19 ENCOUNTER — Other Ambulatory Visit: Payer: Self-pay

## 2019-12-19 ENCOUNTER — Ambulatory Visit (HOSPITAL_BASED_OUTPATIENT_CLINIC_OR_DEPARTMENT_OTHER)
Admission: RE | Admit: 2019-12-19 | Discharge: 2019-12-19 | Disposition: A | Discharge status: Home / Self Care | Payer: BC Managed Care – PPO | Source: Ambulatory Visit | Attending: Family Medicine | Admitting: Family Medicine

## 2019-12-19 ENCOUNTER — Encounter: Payer: Self-pay | Admitting: Family Medicine

## 2019-12-19 ENCOUNTER — Ambulatory Visit (INDEPENDENT_AMBULATORY_CARE_PROVIDER_SITE_OTHER): Payer: BC Managed Care – PPO | Admitting: Family Medicine

## 2019-12-19 VITALS — BP 130/88 | HR 90 | Temp 97.1°F | Resp 18 | Ht 71.0 in | Wt 319.0 lb

## 2019-12-19 DIAGNOSIS — F988 Other specified behavioral and emotional disorders with onset usually occurring in childhood and adolescence: Secondary | ICD-10-CM

## 2019-12-19 DIAGNOSIS — M79642 Pain in left hand: Secondary | ICD-10-CM | POA: Diagnosis not present

## 2019-12-19 DIAGNOSIS — S63642A Sprain of metacarpophalangeal joint of left thumb, initial encounter: Secondary | ICD-10-CM

## 2019-12-19 NOTE — Progress Notes (Signed)
[place] at [facility] [address] [place], [place] [address] [place]  Date:  12/19/2019   Name:  Craig Jordan.   DOB:  06-24-1971   MRN:  [id number]  PCP:  Darreld Mclean, MD    Chief Complaint: Hand Pain (left hand pain, noticied one week ago, swelling)   History of Present Illness:  Craig Jordan. is a 49 y.o. very pleasant male patient who presents with the following:  Patient with history of hyperlipidemia, ADD, obesity.  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  The pain is mainly at the base of his left thumb-no swelling or bruising noted   adderall is working well for him No new SE noted from this medication  Pulse Readings from Last 3 Encounters:  12/19/19 90  08/01/19 86  10/18/18 77    Patient Active Problem List   Diagnosis Date Noted  . Pure hypercholesterolemia 08/29/2016  . Glaucoma 02/05/2015  . Snoring 02/11/2013  . Chest pain, atypical 12/20/2012  . ADD (attention deficit disorder) 12/13/2012  . Obesity 12/13/2012    Past Medical History:  Diagnosis Date  . ADHD (attention deficit hyperactivity disorder)   . Allergy   . Glaucoma   . Hyperlipidemia   . Obesity     Past Surgical History:  Procedure Laterality Date  . KNEE SURGERY     arthroscopic -  B meniscus repairs    Social History   Tobacco Use  . Smoking status: Former Smoker    Quit date: 05/17/1994    Years since quitting: 25.6  . Smokeless tobacco: Never Used  Substance Use Topics  . Alcohol use: No    Alcohol/week: 0.0 standard  drinks    Comment: "Stopped 2 weeks ago" stated on 07/28/2014  . Drug use: Yes    Frequency: 7.0 times per week    Types: Marijuana    Family History  Problem Relation Age of Onset  . Seizures Father   . Epilepsy Father   . Arthritis Father        hip OA s/p hip replacement  . Sleep apnea Brother     No Known Allergies  Medication list has been reviewed and updated.  Current Outpatient Medications on File Prior to Visit  Medication Sig Dispense Refill  . amphetamine-dextroamphetamine (ADDERALL) 20 MG tablet Take 1 tablet (20 mg total) by mouth 3 (three) times daily. 90 tablet 0  . latanoprost (XALATAN) 0.005 % ophthalmic solution     . montelukast (SINGULAIR) 10 MG tablet Take 1 tablet (10 mg total) by mouth at bedtime. Use as needed for nasal symptoms 30 tablet 5  . oxymetazoline (AFRIN) 0.05 % nasal spray Place 2 sprays into the nose at bedtime as needed. For nasal congestion    . pravastatin (PRAVACHOL) 40 MG tablet TAKE 1 TABLET(40 MG) BY MOUTH DAILY 90 tablet 3   No current facility-administered medications on file prior to visit.    Review of Systems:  As per HPI- otherwise negative.  Physical Examination: Vitals:   12/19/19 1420 12/19/19 1436  BP: 130/88   Pulse: (!) 101 90  Resp: 18   Temp: (!) 97.1 F (36.2 C)   SpO2: 98%    Vitals:   12/19/19 1420  Weight: (!) 319 lb (144.7 kg)  Height: 5' 11"  (1.803 m)   Body mass index is 44.49 kg/m. Ideal Body Weight: Weight in (lb) to have BMI = 25: 178.9  GEN: WDWN, NAD, Non-toxic, A & O x 3, obese, looks well HEENT: Atraumatic, Normocephalic. Neck supple. No masses, No LAD. Ears and Nose: No external deformity. CV: RRR, No M/G/R. No JVD. No thrill. No extra heart sounds. PULM: CTA B, no wheezes, crackles, rhonchi. No retractions. No resp. distress. No accessory muscle use. EXTR: No c/c/e NEURO Normal gait.  PSYCH: Normally interactive. Conversant. Not depressed or anxious appearing.  Calm demeanor.  He  has some tenderness to manipulation at the first MCP joint on the left.  Normal range of motion, negative Finkelstein's test.  No swelling or bruising Wt Readings from Last 3 Encounters:  12/19/19 (!) 319 lb (144.7 kg)  08/01/19 (!) 322 lb (146.1 kg)  10/18/18 (!) 321 lb (145.6 kg)    Assessment and Plan: Left hand pain - Plan: DG Hand Complete Left  Sprain of metacarpophalangeal (MCP) joint of left thumb, initial encounter - Plan: DG Hand Complete Left  Attention deficit disorder (ADD) without hyperactivity  Here today with pain and possible sprain of the left first MCP joint Obtain plain film for more information Suggested an over-the-counter thumb spica splint as needed for the next 1 to 2 weeks He will let me know if not improved with this conservative measure Continue current treatment of ADD with Adderall Moderate medical decision making today This visit occurred during the SARS-CoV-2 public health emergency.  Safety protocols were in place, including screening questions prior to the visit, additional usage of staff PPE, and extensive cleaning of exam room while observing appropriate contact time as indicated for disinfecting solutions.    Signed Lamar Blinks, MD

## 2019-12-19 NOTE — Patient Instructions (Signed)
Great to see you as always Please have your hand x-ray and then you can go home- I will be in touch with your report asap  Please try a thumb spica splint as is convenient for a week or so- let me know if your symptoms do not resolve with resting the joint- Sooner if worse.

## 2019-12-20 ENCOUNTER — Encounter: Payer: Self-pay | Admitting: Family Medicine

## 2019-12-26 ENCOUNTER — Other Ambulatory Visit: Payer: Self-pay | Admitting: Family Medicine

## 2019-12-26 DIAGNOSIS — F988 Other specified behavioral and emotional disorders with onset usually occurring in childhood and adolescence: Secondary | ICD-10-CM

## 2019-12-27 DIAGNOSIS — H401131 Primary open-angle glaucoma, bilateral, mild stage: Secondary | ICD-10-CM | POA: Diagnosis not present

## 2019-12-27 MED ORDER — AMPHETAMINE-DEXTROAMPHETAMINE 20 MG PO TABS: 20.0000 mg | ORAL_TABLET | Freq: Three times a day (TID) | ORAL | 0 refills | Status: DC

## 2019-12-27 NOTE — Telephone Encounter (Signed)
Requesting:Adderall  Contract:none UDS:10/18/2018 Last Visit:12/19/2019 Next Visit:none scheduled Last Refill:10/19/2019  Please Advise

## 2020-01-01 IMAGING — DX DG HIP (WITH OR WITHOUT PELVIS) 2-3V*R*
3 series · 3 of 3 positions shown · non-contrast
Comparison: None.

CLINICAL DATA: Pain.

EXAM:
DG HIP (WITH OR WITHOUT PELVIS) 2-3V RIGHT

[pelvis ap]
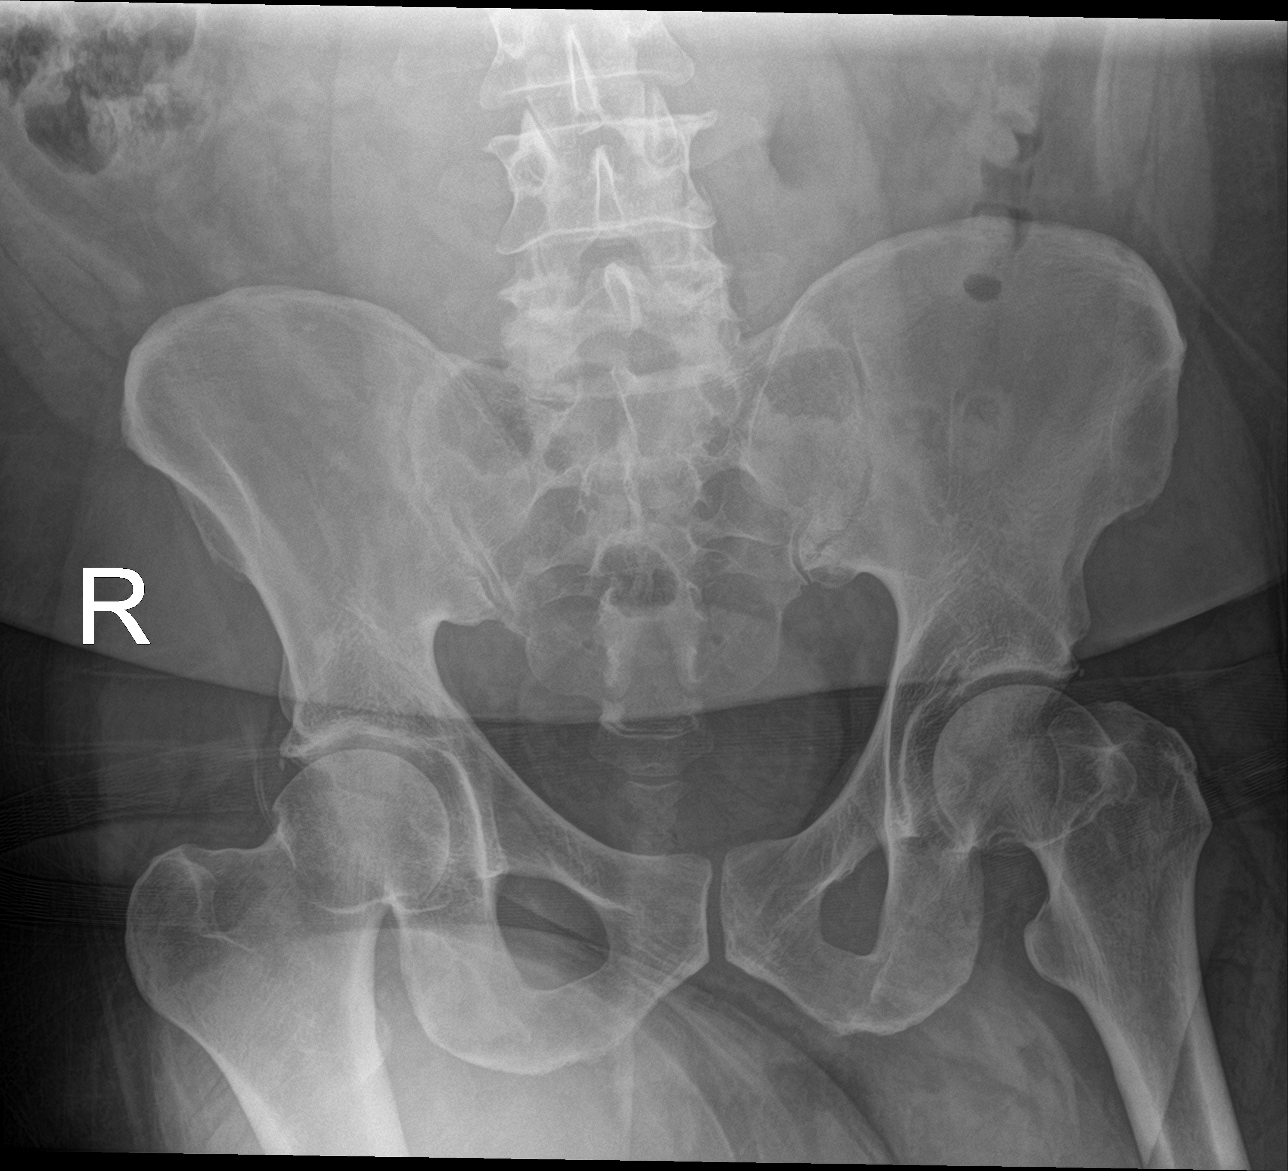

[hip ap]
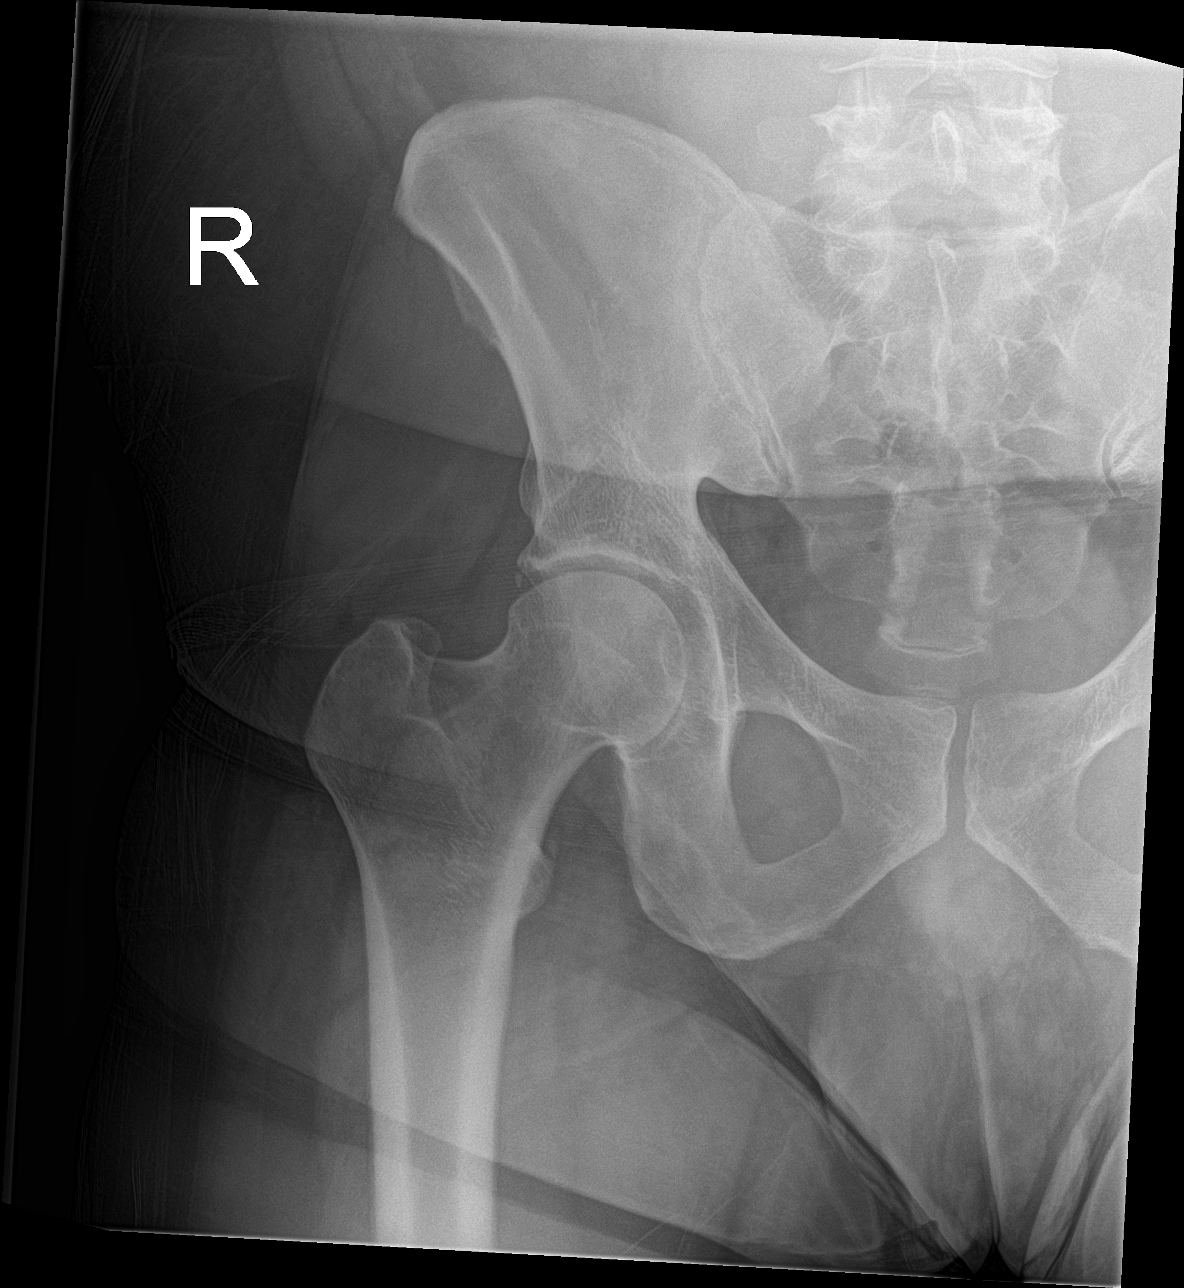

[hip lat]
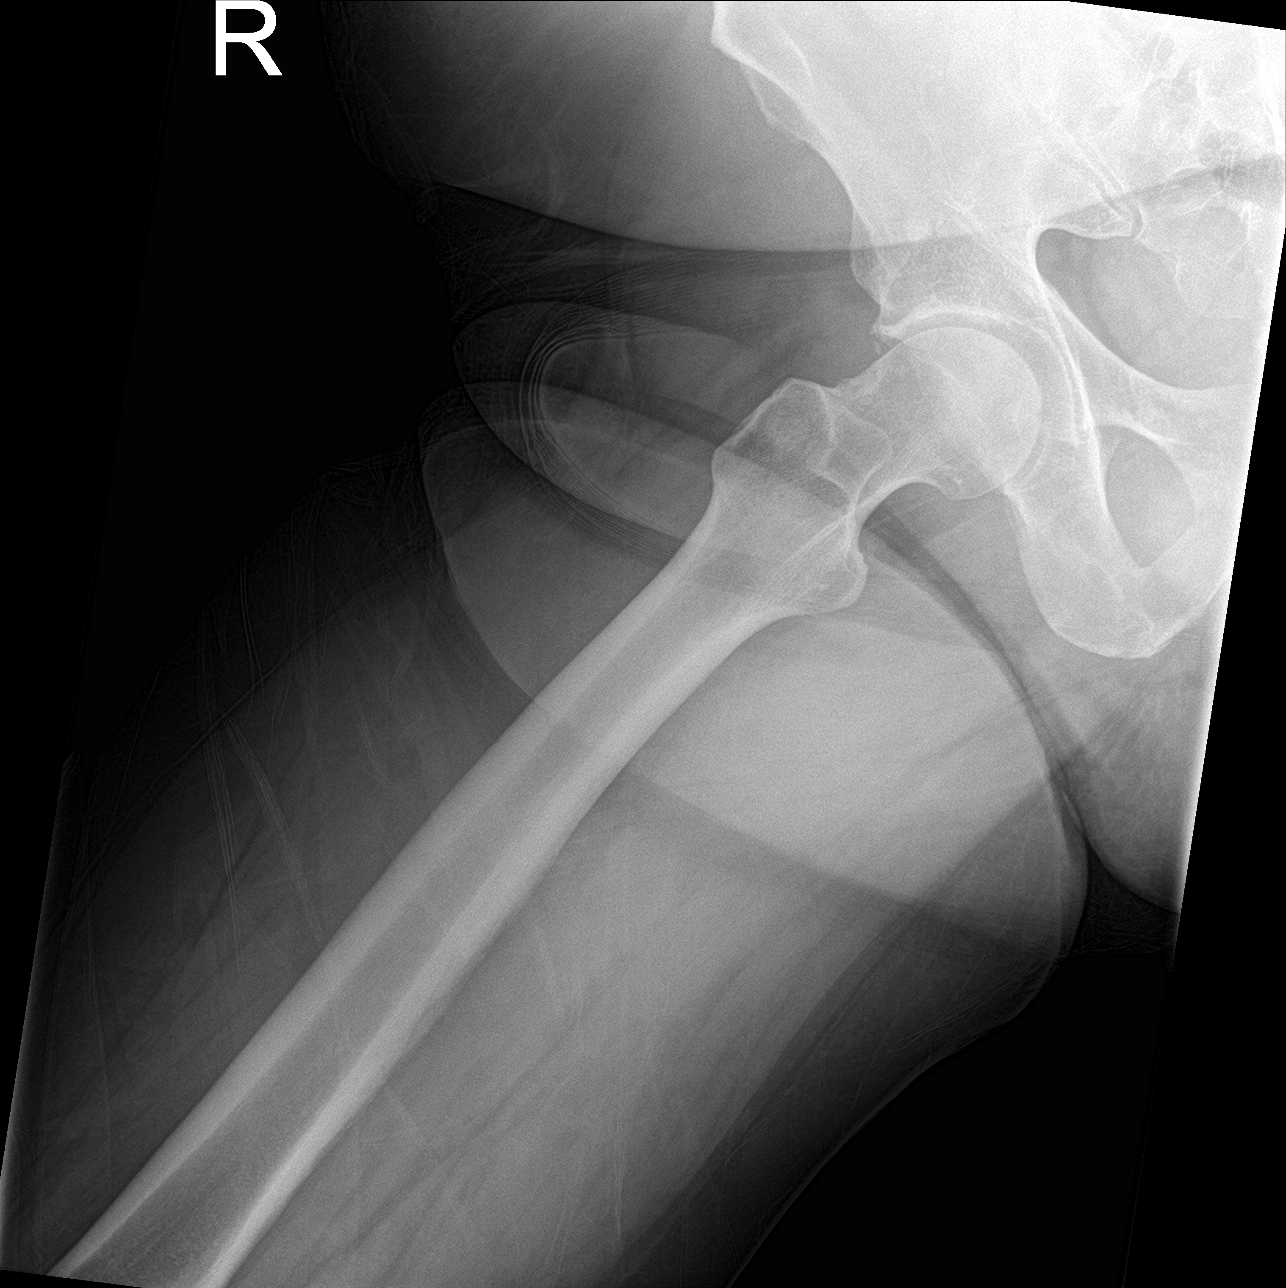

[3 of 3 positions shown; findings below may reference images not displayed]

FINDINGS: A tiny calcification is seen the soft tissues adjacent to the
inferior trochanter on one view. The AP and lateral views
demonstrate no evidence of fracture or dislocation. Mild
degenerative changes. The pelvic bones are unremarkable. The left
hip is not well assessed due to positioning. By report, the patient
is only having right-sided symptoms.
IMPRESSION: 1. No fracture or dislocation of the right hip.
2. Mild degenerative changes in the right hip.
3. A calcification is seen in the soft tissues adjacent to the
inferior trochanter on the right. This could represent enthesopathic
change or sequela of an age indeterminate avulsion injury.
4. Evaluation of the left hip is limited due to positioning but the
patient was not reported have symptoms on the left. If there are
left-sided symptoms, dedicated imaging would be necessary.

## 2020-01-26 ENCOUNTER — Encounter: Payer: Self-pay | Admitting: Family Medicine

## 2020-01-26 DIAGNOSIS — F988 Other specified behavioral and emotional disorders with onset usually occurring in childhood and adolescence: Secondary | ICD-10-CM

## 2020-02-08 ENCOUNTER — Encounter: Payer: Self-pay | Admitting: Family Medicine

## 2020-02-09 DIAGNOSIS — Z03818 Encounter for observation for suspected exposure to other biological agents ruled out: Secondary | ICD-10-CM | POA: Diagnosis not present

## 2020-02-09 DIAGNOSIS — Z20828 Contact with and (suspected) exposure to other viral communicable diseases: Secondary | ICD-10-CM | POA: Diagnosis not present

## 2020-02-13 DIAGNOSIS — Z03818 Encounter for observation for suspected exposure to other biological agents ruled out: Secondary | ICD-10-CM | POA: Diagnosis not present

## 2020-02-13 DIAGNOSIS — Z20828 Contact with and (suspected) exposure to other viral communicable diseases: Secondary | ICD-10-CM | POA: Diagnosis not present

## 2020-03-05 ENCOUNTER — Encounter: Payer: Self-pay | Admitting: Family Medicine

## 2020-03-05 DIAGNOSIS — F988 Other specified behavioral and emotional disorders with onset usually occurring in childhood and adolescence: Secondary | ICD-10-CM

## 2020-03-06 MED ORDER — AMPHETAMINE-DEXTROAMPHETAMINE 20 MG PO TABS: 20.0000 mg | ORAL_TABLET | Freq: Three times a day (TID) | ORAL | 0 refills | Status: DC

## 2020-03-06 NOTE — Telephone Encounter (Signed)
Requesting:Adderall Contract:none UDS:10/18/2018 Last Visit:12/19/2019 Next Visit:none Last Refill:12/27/2019 3 rxs  Please Advise

## 2020-04-03 ENCOUNTER — Encounter: Payer: Self-pay | Admitting: Family Medicine

## 2020-04-03 DIAGNOSIS — F988 Other specified behavioral and emotional disorders with onset usually occurring in childhood and adolescence: Secondary | ICD-10-CM

## 2020-06-04 ENCOUNTER — Encounter: Payer: Self-pay | Admitting: Family Medicine

## 2020-06-04 DIAGNOSIS — F988 Other specified behavioral and emotional disorders with onset usually occurring in childhood and adolescence: Secondary | ICD-10-CM

## 2020-06-04 MED ORDER — AMPHETAMINE-DEXTROAMPHETAMINE 20 MG PO TABS: 20.0000 mg | ORAL_TABLET | Freq: Three times a day (TID) | ORAL | 0 refills | Status: DC

## 2020-06-14 ENCOUNTER — Encounter: Payer: Self-pay | Admitting: Family Medicine

## 2020-08-14 NOTE — Patient Instructions (Addendum)
It was great to see you again today, I will be in touch your labs soon as possible Assuming all is well, please see me in about 6 months  I think the bump on your lower lid may actually be a cyst as opposed to a stye You can continue hot compresses, but if this does not go away please ask your eye doctor about it.  They may suggest removing it surgically if you wish  We will order a cologuard kit for you to screen for colon cancer at home

## 2020-08-14 NOTE — Progress Notes (Addendum)
[facility] [address] [place], [place] [address] [phone]  Date:  08/16/2020   Name:  Craig Jordan.   DOB:  [birth year]   MRN:  [id number]  PCP:  Darreld Mclean, MD    Chief Complaint: Medication Management (follow up Adderall, flu shot) and Stye (right eye, 3 weeks, using hot compress no improvment )   History of Present Illness:  Craig Jordan. is a 49 y.o. very pleasant male patient who presents with the following:  Craig Jordan is here today for a follow-up visit and medication check History of ADD, glaucoma, obesity, hyperlipidemia, prediabetes Last by myself in February of this year  Married to Craig Jordan, they do not have children He owns a Biomedical scientist business  Flu vaccine- give today  COVID-19 series- he had primary series and plans to do a booster at 13-monthmark A1c, hep C screening, Routine blood work due today His GF had colon cancer he thinks- not 100% sure -no other family history.  He qualifies for Cologuard, he would like to use the screening method  He has a possible stye on his right lower lid for about 3 weeks now Nothing has drained as of yet despite use of frequent hot compresses It is not painful, but is noticeable  He has been inhibited in his activity due to an ankle injury and gained a few pounds.  He sprained his ankle.  It is now better, he is back to normal activity  07/10/2020  1   06/04/2020  Dextroamp-Amphetamin 20 MG Tab  90.00  30 Je Cop   [id number]  Wal ([id number])   0/0   Comm Ins   [place]  06/04/2020  1   06/04/2020  Dextroamp-Amphetamin 20 MG Tab  90.00  30 Je Cop   [id number]   Wal ([id number])   0/0   Comm Ins   [place]  05/05/2020  1   03/06/2020  Dextroamp-Amphetamin 20 MG Tab  90.00  30 Je Cop   [id number]  Wal ([id number])   0/0   Comm Ins   [place]  04/05/2020  1   03/06/2020  Dextroamp-Amphetamin 20 MG Tab  90.00  30 Je Cop   [id number]  Wal ([id number])   0/0   Comm Ins   [place]  03/06/2020  1   03/06/2020   Dextroamp-Amphetamin 20 MG Tab  90.00  30 Je Cop   [id number]  Wal ([id number])   0/0   Comm Ins   [place]  01/27/2020  1   12/27/2019  Dextroamp-Amphetamin 20 MG Tab  90.00  30 Je Cop   [id number]  Wal ([id number])   0/0   Comm Ins   [place]  12/27/2019  1   12/27/2019  Dextroamp-Amphetamin 20 MG Tab  90.00  30 Je Cop   [id number]  Wal ([id number])   0/0   Comm Ins   [place]  11/21/2019  1   10/19/2019  Dextroamp-Amphetamin 20 MG Tab  90.00  30 Je Cop   [id number]   Wal ([id number])   0/0   Comm Ins   [place]  10/19/2019  1   10/19/2019  Dextroamp-Amphetamin 20 MG Tab  90.00  30 Je Cop   [id number]  Wal ([id number])   0/0         Patient Active Problem List   Diagnosis Date Noted  . Pure hypercholesterolemia 08/29/2016  . Glaucoma  02/05/2015  . Snoring 02/11/2013  . Chest pain, atypical 12/20/2012  . ADD (attention deficit disorder) 12/13/2012  . Obesity 12/13/2012    Past Medical History:  Diagnosis Date  . ADHD (attention deficit hyperactivity disorder)   . Allergy   . Glaucoma   . Hyperlipidemia   . Obesity     Past Surgical History:  Procedure Laterality Date  . KNEE SURGERY     arthroscopic -  B meniscus repairs    Social History   Tobacco Use  . Smoking status: Former Smoker    Quit date: 05/17/1994    Years since quitting: 26.2  . Smokeless tobacco: Never Used  Substance Use Topics  . Alcohol use: No    Alcohol/week: 0.0 standard drinks    Comment: "Stopped 2 weeks ago" stated on 07/28/2014  . Drug use: Yes    Frequency: 7.0 times per week    Types: Marijuana    Family History  Problem Relation Age of Onset  . Seizures Father   . Epilepsy Father   . Arthritis Father        hip OA s/p hip replacement  . Sleep apnea Brother     No Known Allergies  Medication list has been reviewed and updated.  Current Outpatient Medications on File Prior to Visit  Medication Sig Dispense Refill  . diphenhydrAMINE (BENADRYL) 25 MG tablet Take 25 mg by mouth at bedtime as needed.    . latanoprost (XALATAN) 0.005 % ophthalmic  solution     . loratadine (CLARITIN) 10 MG tablet Take 10 mg by mouth daily.    Marland Kitchen oxymetazoline (AFRIN) 0.05 % nasal spray Place 2 sprays into the nose at bedtime as needed. For nasal congestion     No current facility-administered medications on file prior to visit.    Review of Systems:  As per HPI- otherwise negative.   Physical Examination: Vitals:   08/16/20 1047  BP: 122/90  Pulse: 71  Resp: 18  SpO2: 96%   Vitals:   08/16/20 1047  Weight: (!) 322 lb (146.1 kg)  Height: 5' 11"  (1.803 m)   Body mass index is 44.91 kg/m. Ideal Body Weight: Weight in (lb) to have BMI = 25: 178.9  GEN: no acute distress.  Obese, looks well  HEENT: Atraumatic, Normocephalic.  Ears and Nose: No external deformity. CV: RRR, No M/G/R. No JVD. No thrill. No extra heart sounds. PULM: CTA B, no wheezes, crackles, rhonchi. No retractions. No resp. distress. No accessory muscle use. ABD: S, NT, ND, +BS. No rebound. No HSM. EXTR: No c/c/e PSYCH: Normally interactive. Conversant.  The medial aspect of the right lower lid displays a firm, mobile mass which seems more consistent with a cyst than stye Left ankle exam currently normal   Assessment and Plan: Attention deficit disorder (ADD) without hyperactivity - Plan: amphetamine-dextroamphetamine (ADDERALL) 20 MG tablet, amphetamine-dextroamphetamine (ADDERALL) 20 MG tablet, amphetamine-dextroamphetamine (ADDERALL) 20 MG tablet  Hyperlipidemia, unspecified hyperlipidemia type - Plan: Lipid panel  Screening for deficiency anemia - Plan: CBC  Screening for diabetes mellitus - Plan: Comprehensive metabolic panel, Hemoglobin A1c  Screening for prostate cancer - Plan: PSA  Encounter for hepatitis C screening test for low risk patient - Plan: Hepatitis C antibody  Screening for HIV without presence of risk factors - Plan: HIV Antibody (routine testing w rflx)  Pure hypercholesterolemia - Plan: pravastatin (PRAVACHOL) 40 MG tablet  Needs flu  shot - Plan: Flu Vaccine QUAD 6+ mos PF IM (Fluarix Quad PF)  Screening  for colon cancer  Eyelid abnormality  Patient reports doing well on current dose of Adderall.  Refill this for him today for 3 months Refill Pravachol Flu shot given Order Cologuard Will plan further follow- up pending labs. Discussed cyst versus stye on right lower lid.  He actually plans to see his eye doctor for routine visit relatively soon.  Advised him that he may certainly continue hot compresses as desired.  However, I think this may actually be a cyst which can be removed electively if he wishes Plan to visit in 6 months This visit occurred during the SARS-CoV-2 public health emergency.  Safety protocols were in place, including screening questions prior to the visit, additional usage of staff PPE, and extensive cleaning of exam room while observing appropriate contact time as indicated for disinfecting solutions.    Signed Lamar Blinks, MD  Addendum 10/1, received labs as below-message to patient  Results for orders placed or performed in visit on 08/16/20  CBC  Result Value Ref Range   WBC 6.5 3.8 - 10.8 Thousand/uL   RBC 5.14 4.20 - 5.80 Million/uL   Hemoglobin 15.1 13.2 - 17.1 g/dL   HCT 44.3 38 - 50 %   MCV 86.2 80.0 - 100.0 fL   MCH 29.4 27.0 - 33.0 pg   MCHC 34.1 32.0 - 36.0 g/dL   RDW 12.4 11.0 - 15.0 %   Platelets 247 140 - 400 Thousand/uL   MPV 10.9 7.5 - 12.5 fL  Comprehensive metabolic panel  Result Value Ref Range   Glucose, Bld 94 65 - 99 mg/dL   BUN 17 7 - 25 mg/dL   Creat 1.07 0.60 - 1.35 mg/dL   BUN/Creatinine Ratio NOT APPLICABLE 6 - 22 (calc)   Sodium 138 135 - 146 mmol/L   Potassium 4.5 3.5 - 5.3 mmol/L   Chloride 103 98 - 110 mmol/L   CO2 26 20 - 32 mmol/L   Calcium 9.8 8.6 - 10.3 mg/dL   Total Protein 6.7 6.1 - 8.1 g/dL   Albumin 4.3 3.6 - 5.1 g/dL   Globulin 2.4 1.9 - 3.7 g/dL (calc)   AG Ratio 1.8 1.0 - 2.5 (calc)   Total Bilirubin 0.4 0.2 - 1.2 mg/dL    Alkaline phosphatase (APISO) 73 36 - 130 U/L   AST 17 10 - 40 U/L   ALT 22 9 - 46 U/L  Hemoglobin A1c  Result Value Ref Range   Hgb A1c MFr Bld 5.5 <5.7 % of total Hgb   Mean Plasma Glucose 111 (calc)   eAG (mmol/L) 6.2 (calc)  Lipid panel  Result Value Ref Range   Cholesterol 190 <200 mg/dL   HDL 65 > OR = 40 mg/dL   Triglycerides 85 <150 mg/dL   LDL Cholesterol (Calc) 107 (H) mg/dL (calc)   Total CHOL/HDL Ratio 2.9 <5.0 (calc)   Non-HDL Cholesterol (Calc) 125 <130 mg/dL (calc)  PSA  Result Value Ref Range   PSA 0.64 < OR = 4.0 ng/mL

## 2020-08-16 ENCOUNTER — Encounter: Payer: Self-pay | Admitting: Family Medicine

## 2020-08-16 ENCOUNTER — Other Ambulatory Visit: Payer: Self-pay

## 2020-08-16 ENCOUNTER — Ambulatory Visit (INDEPENDENT_AMBULATORY_CARE_PROVIDER_SITE_OTHER): Payer: BC Managed Care – PPO | Admitting: Family Medicine

## 2020-08-16 VITALS — BP 122/90 | HR 71 | Resp 18 | Ht 71.0 in | Wt 322.0 lb

## 2020-08-16 DIAGNOSIS — Z125 Encounter for screening for malignant neoplasm of prostate: Secondary | ICD-10-CM | POA: Diagnosis not present

## 2020-08-16 DIAGNOSIS — Z1211 Encounter for screening for malignant neoplasm of colon: Secondary | ICD-10-CM

## 2020-08-16 DIAGNOSIS — Z114 Encounter for screening for human immunodeficiency virus [HIV]: Secondary | ICD-10-CM

## 2020-08-16 DIAGNOSIS — E785 Hyperlipidemia, unspecified: Secondary | ICD-10-CM | POA: Diagnosis not present

## 2020-08-16 DIAGNOSIS — Z1159 Encounter for screening for other viral diseases: Secondary | ICD-10-CM | POA: Diagnosis not present

## 2020-08-16 DIAGNOSIS — F988 Other specified behavioral and emotional disorders with onset usually occurring in childhood and adolescence: Secondary | ICD-10-CM

## 2020-08-16 DIAGNOSIS — Z131 Encounter for screening for diabetes mellitus: Secondary | ICD-10-CM

## 2020-08-16 DIAGNOSIS — H029 Unspecified disorder of eyelid: Secondary | ICD-10-CM

## 2020-08-16 DIAGNOSIS — Z23 Encounter for immunization: Secondary | ICD-10-CM

## 2020-08-16 DIAGNOSIS — Z130 Encounter for screening for diseases of the blood and blood-forming organs and certain disorders involving the immune mechanism: Secondary | ICD-10-CM

## 2020-08-16 DIAGNOSIS — E7800 Pure hypercholesterolemia, unspecified: Secondary | ICD-10-CM

## 2020-08-16 MED ORDER — AMPHETAMINE-DEXTROAMPHETAMINE 20 MG PO TABS: 20.0000 mg | ORAL_TABLET | Freq: Three times a day (TID) | ORAL | 0 refills | Status: DC

## 2020-08-16 MED ORDER — PRAVASTATIN SODIUM 40 MG PO TABS: ORAL_TABLET | ORAL | 3 refills | Status: DC

## 2020-08-17 ENCOUNTER — Encounter: Payer: Self-pay | Admitting: Family Medicine

## 2020-08-17 LAB — CBC
HCT: 44.3 % (ref 38.5–50.0)
Hemoglobin: 15.1 g/dL (ref 13.2–17.1)
MCH: 29.4 pg (ref 27.0–33.0)
MCHC: 34.1 g/dL (ref 32.0–36.0)
MCV: 86.2 fL (ref 80.0–100.0)
MPV: 10.9 fL (ref 7.5–12.5)
Platelets: 247 10*3/uL (ref 140–400)
RBC: 5.14 10*6/uL (ref 4.20–5.80)
RDW: 12.4 % (ref 11.0–15.0)
WBC: 6.5 10*3/uL (ref 3.8–10.8)

## 2020-08-17 LAB — COMPREHENSIVE METABOLIC PANEL
AG Ratio: 1.8 (calc) (ref 1.0–2.5)
ALT: 22 U/L (ref 9–46)
AST: 17 U/L (ref 10–40)
Albumin: 4.3 g/dL (ref 3.6–5.1)
Alkaline phosphatase (APISO): 73 U/L (ref 36–130)
BUN: 17 mg/dL (ref 7–25)
CO2: 26 mmol/L (ref 20–32)
Calcium: 9.8 mg/dL (ref 8.6–10.3)
Chloride: 103 mmol/L (ref 98–110)
Creat: 1.07 mg/dL (ref 0.60–1.35)
Globulin: 2.4 g/dL (calc) (ref 1.9–3.7)
Glucose, Bld: 94 mg/dL (ref 65–99)
Potassium: 4.5 mmol/L (ref 3.5–5.3)
Sodium: 138 mmol/L (ref 135–146)
Total Bilirubin: 0.4 mg/dL (ref 0.2–1.2)
Total Protein: 6.7 g/dL (ref 6.1–8.1)

## 2020-08-17 LAB — LIPID PANEL
Cholesterol: 190 mg/dL (ref <200)
HDL: 65 mg/dL (ref >=40)
LDL Cholesterol (Calc): 107 mg/dL (calc) — ABNORMAL HIGH
Non-HDL Cholesterol (Calc): 125 mg/dL (calc) (ref <130)
Total CHOL/HDL Ratio: 2.9 (calc) (ref <5.0)
Triglycerides: 85 mg/dL (ref <150)

## 2020-08-17 LAB — PSA: PSA: 0.64 ng/mL (ref <=4.0)

## 2020-08-17 LAB — HEPATITIS C ANTIBODY
Hepatitis C Ab: NONREACTIVE
SIGNAL TO CUT-OFF: 0.01 (ref <1.00)

## 2020-08-17 LAB — HIV ANTIBODY (ROUTINE TESTING W REFLEX): HIV 1&2 Ab, 4th Generation: NONREACTIVE

## 2020-08-17 LAB — HEMOGLOBIN A1C
Hgb A1c MFr Bld: 5.5 % of total Hgb (ref <5.7)
Mean Plasma Glucose: 111 (calc)
eAG (mmol/L): 6.2 (calc)

## 2020-12-27 ENCOUNTER — Encounter: Payer: Self-pay | Admitting: Family Medicine

## 2020-12-27 DIAGNOSIS — F988 Other specified behavioral and emotional disorders with onset usually occurring in childhood and adolescence: Secondary | ICD-10-CM

## 2020-12-28 MED ORDER — AMPHETAMINE-DEXTROAMPHETAMINE 20 MG PO TABS: 20.0000 mg | ORAL_TABLET | Freq: Three times a day (TID) | ORAL | 0 refills | Status: DC

## 2020-12-28 MED ORDER — AMPHETAMINE-DEXTROAMPHETAMINE 20 MG PO TABS
20.0000 mg | ORAL_TABLET | Freq: Three times a day (TID) | ORAL | 0 refills | Status: DC
Start: 2020-12-28 — End: 2021-02-13

## 2021-02-09 NOTE — Progress Notes (Signed)
[facility] [address], [place], [place] [address] [phone] [id number]  Date:  02/13/2021   Name:  Craig Jordan.   DOB:  09-Oct-1971   MRN:  [id number]  PCP:  Darreld Mclean, MD    Chief Complaint: ADD (6 month follow up)   History of Present Illness:  Craig Jordan. is a 50 y.o. very pleasant male patient who presents with the following:  Follow-up visit today- History of ADD, glaucoma, obesity, hyperlipidemia, prediabetes Last seen by myself 6 months ago  His work is busy -he is in [employer], they are reaching their busy season Doing well with current dosage of Adderall Colon cancer screen- no family history of colon cancer.  Discussed options and he would prefer Cologuard.  Will order a cologuard for him  covid UTD Most recent labs 6 months ago  He is feeling well-continues to try and lose weight He has stopped smoking MJ- he and his wife are adopting a child who will be born in 2-3 months, a baby boy!   Wt Readings from Last 3 Encounters:  02/13/21 (!) 331 lb (150.1 kg)  08/16/20 (!) 322 lb (146.1 kg)  12/19/19 (!) 319 lb (144.7 kg)     Lab Results  Component Value Date   HGBA1C 5.5 08/16/2020     01/25/2021  12/28/2020   1  Dextroamp-Amphetamin 20 Mg Tab  90.00  30  Je Cop  [id number]  Wal ([id number])  0/0   Comm Ins  [place]    12/29/2020  12/28/2020   1  Dextroamp-Amphetamin 20 Mg Tab  90.00  30  Je Cop  [id number]  Wal ([id number])  0/0   Comm Ins  [place]    11/28/2020  08/16/2020   1  Dextroamp-Amphetamin 20 Mg Tab  90.00  30  Je Cop  [id number]  Wal ([id number])  0/0   Comm Ins  [place]    10/30/2020  08/16/2020   1  Dextroamp-Amphetamin 20 Mg Tab  90.00  30  Je Cop  [id number]  Wal ([id number])  0/0   Comm Ins  [place]    10/01/2020  08/16/2020   1  Dextroamp-Amphetamin 20 Mg Tab  90.00  30  Je Cop  [id number]  Wal ([id number])  0/0   Comm Ins  [place]    08/27/2020  06/04/2020   1  Dextroamp-Amphetamin 20 Mg Tab  90.00  30  Je Cop  [id number]   Wal ([id number])  0/0   Comm Ins  [place]    07/10/2020  06/04/2020   1  Dextroamp-Amphetamin 20 Mg Tab  90.00  30  Je Cop  [id number]  Wal ([id number])  0/0   Comm Ins      06/04/2020  06/04/2020   1  Dextroamp-Amphetamin 20 Mg Tab  90.00  30  Je Cop  [id number]            Patient Active Problem List   Diagnosis Date Noted  . Pure hypercholesterolemia 08/29/2016  . Glaucoma 02/05/2015  . Snoring 02/11/2013  . Chest pain, atypical 12/20/2012  . ADD (attention deficit disorder) 12/13/2012  . Obesity 12/13/2012    Past Medical History:  Diagnosis Date  . ADHD (attention deficit hyperactivity disorder)   . Allergy   . Glaucoma   . Hyperlipidemia   . Obesity     Past Surgical History:  Procedure Laterality Date  . KNEE SURGERY  arthroscopic -  B meniscus repairs    Social History   Tobacco Use  . Smoking status: Former Smoker    Quit date: 05/17/1994    Years since quitting: 26.7  . Smokeless tobacco: Never Used  Substance Use Topics  . Alcohol use: No    Alcohol/week: 0.0 standard drinks    Comment: "Stopped 2 weeks ago" stated on 07/28/2014  . Drug use: Yes    Frequency: 7.0 times per week    Types: Marijuana    Family History  Problem Relation Age of Onset  . Seizures Father   . Epilepsy Father   . Arthritis Father        hip OA s/p hip replacement  . Sleep apnea Brother     No Known Allergies  Medication list has been reviewed and updated.  Current Outpatient Medications on File Prior to Visit  Medication Sig Dispense Refill  . diphenhydrAMINE (BENADRYL) 25 MG tablet Take 25 mg by mouth at bedtime as needed.    . latanoprost (XALATAN) 0.005 % ophthalmic solution     . loratadine (CLARITIN) 10 MG tablet Take 10 mg by mouth daily.    Marland Kitchen oxymetazoline (AFRIN) 0.05 % nasal spray Place 2 sprays into the nose at bedtime as needed. For nasal congestion    . pravastatin (PRAVACHOL) 40 MG tablet TAKE 1 TABLET(40 MG) BY MOUTH DAILY 90 tablet 3   No current facility-administered  medications on file prior to visit.    Review of Systems:  As per HPI- otherwise negative.   Physical Examination: Vitals:   02/13/21 1106 02/13/21 1122  BP: (!) 142/88 125/90  Pulse: (!) 58   Resp: 18   Temp: 98.3 F (36.8 C)   SpO2: 97%    Vitals:   02/13/21 1106  Weight: (!) 331 lb (150.1 kg)  Height: 5' 11"  (1.803 m)   Body mass index is 46.17 kg/m. Ideal Body Weight: Weight in (lb) to have BMI = 25: 178.9  GEN: no acute distress.  Obese, looks well and his normal self HEENT: Atraumatic, Normocephalic.  Ears and Nose: No external deformity. CV: RRR, No M/G/R. No JVD. No thrill. No extra heart sounds. PULM: CTA B, no wheezes, crackles, rhonchi. No retractions. No resp. distress. No accessory muscle use. ABD: S, NT, ND, +BS. No rebound. No HSM. EXTR: No c/c/e PSYCH: Normally interactive. Conversant.    Assessment and Plan: Attention deficit disorder (ADD) without hyperactivity - Plan: amphetamine-dextroamphetamine (ADDERALL) 20 MG tablet, amphetamine-dextroamphetamine (ADDERALL) 20 MG tablet, amphetamine-dextroamphetamine (ADDERALL) 20 MG tablet  Hyperlipidemia, unspecified hyperlipidemia type  Screening for diabetes mellitus  Screening for colon cancer - Plan: Cologuard  Here today for follow-up visit.  Blood pressures under okay control.  He is taking statin, recent lipid panel showed improvement Order Cologuard to screen for colon cancer A1c 6 months ago, normal range Refilled Adderall for 3 months Offered our deepest congratulations on their upcoming adoption Recheck 6 months This visit occurred during the SARS-CoV-2 public health emergency.  Safety protocols were in place, including screening questions prior to the visit, additional usage of staff PPE, and extensive cleaning of exam room while observing appropriate contact time as indicated for disinfecting solutions.    Signed Lamar Blinks, MD

## 2021-02-09 NOTE — Patient Instructions (Addendum)
Good to see you again today- please plan to see me in 6 months assuming all is well  We will get a cologuard kit sent to your home congratulations on impending parenthood!  We are so excited for you and your wife!

## 2021-02-13 ENCOUNTER — Other Ambulatory Visit: Payer: Self-pay

## 2021-02-13 ENCOUNTER — Encounter: Payer: Self-pay | Admitting: Family Medicine

## 2021-02-13 ENCOUNTER — Ambulatory Visit (INDEPENDENT_AMBULATORY_CARE_PROVIDER_SITE_OTHER): Payer: BC Managed Care – PPO | Admitting: Family Medicine

## 2021-02-13 VITALS — BP 125/90 | HR 58 | Temp 98.3°F | Resp 18 | Ht 71.0 in | Wt 331.0 lb

## 2021-02-13 DIAGNOSIS — F988 Other specified behavioral and emotional disorders with onset usually occurring in childhood and adolescence: Secondary | ICD-10-CM

## 2021-02-13 DIAGNOSIS — E785 Hyperlipidemia, unspecified: Secondary | ICD-10-CM | POA: Diagnosis not present

## 2021-02-13 DIAGNOSIS — Z1211 Encounter for screening for malignant neoplasm of colon: Secondary | ICD-10-CM | POA: Diagnosis not present

## 2021-02-13 DIAGNOSIS — Z131 Encounter for screening for diabetes mellitus: Secondary | ICD-10-CM

## 2021-02-13 MED ORDER — AMPHETAMINE-DEXTROAMPHETAMINE 20 MG PO TABS: 20.0000 mg | ORAL_TABLET | Freq: Three times a day (TID) | ORAL | 0 refills | Status: DC

## 2021-02-13 MED ORDER — AMPHETAMINE-DEXTROAMPHETAMINE 20 MG PO TABS
20.0000 mg | ORAL_TABLET | Freq: Three times a day (TID) | ORAL | 0 refills | Status: DC
Start: 2021-02-13 — End: 2021-07-07

## 2021-02-26 DIAGNOSIS — Z1211 Encounter for screening for malignant neoplasm of colon: Secondary | ICD-10-CM | POA: Diagnosis not present

## 2021-03-04 ENCOUNTER — Encounter: Payer: Self-pay | Admitting: Family Medicine

## 2021-03-05 LAB — EXTERNAL GENERIC LAB PROCEDURE: COLOGUARD: NEGATIVE

## 2021-03-05 LAB — COLOGUARD
COLOGUARD: NEGATIVE
Cologuard: NEGATIVE

## 2021-03-06 ENCOUNTER — Encounter: Payer: Self-pay | Admitting: Family Medicine

## 2021-03-13 ENCOUNTER — Encounter: Payer: Self-pay | Admitting: Family Medicine

## 2021-04-05 ENCOUNTER — Encounter: Payer: Self-pay | Admitting: Family Medicine

## 2021-04-08 NOTE — Telephone Encounter (Signed)
Paperwork placed in providers folder for review.

## 2021-04-09 ENCOUNTER — Encounter: Payer: Self-pay | Admitting: Family Medicine

## 2021-06-14 DIAGNOSIS — Z23 Encounter for immunization: Secondary | ICD-10-CM | POA: Diagnosis not present

## 2021-07-06 ENCOUNTER — Encounter: Payer: Self-pay | Admitting: Family Medicine

## 2021-07-06 DIAGNOSIS — F988 Other specified behavioral and emotional disorders with onset usually occurring in childhood and adolescence: Secondary | ICD-10-CM

## 2021-07-07 MED ORDER — AMPHETAMINE-DEXTROAMPHETAMINE 20 MG PO TABS: 20.0000 mg | ORAL_TABLET | Freq: Three times a day (TID) | ORAL | 0 refills | Status: DC

## 2021-07-25 ENCOUNTER — Other Ambulatory Visit: Payer: Self-pay

## 2021-07-25 ENCOUNTER — Encounter (HOSPITAL_BASED_OUTPATIENT_CLINIC_OR_DEPARTMENT_OTHER): Payer: Self-pay | Admitting: Emergency Medicine

## 2021-07-25 ENCOUNTER — Emergency Department (HOSPITAL_BASED_OUTPATIENT_CLINIC_OR_DEPARTMENT_OTHER)
Admission: EM | Admit: 2021-07-25 | Discharge: 2021-07-25 | Disposition: A | Discharge status: Home / Self Care | Payer: BC Managed Care – PPO | Attending: Emergency Medicine | Admitting: Emergency Medicine

## 2021-07-25 ENCOUNTER — Emergency Department (HOSPITAL_BASED_OUTPATIENT_CLINIC_OR_DEPARTMENT_OTHER): Payer: BC Managed Care – PPO | Admitting: Radiology

## 2021-07-25 DIAGNOSIS — S93402A Sprain of unspecified ligament of left ankle, initial encounter: Secondary | ICD-10-CM | POA: Insufficient documentation

## 2021-07-25 DIAGNOSIS — Z79899 Other long term (current) drug therapy: Secondary | ICD-10-CM | POA: Diagnosis not present

## 2021-07-25 DIAGNOSIS — R600 Localized edema: Secondary | ICD-10-CM | POA: Diagnosis not present

## 2021-07-25 DIAGNOSIS — E119 Type 2 diabetes mellitus without complications: Secondary | ICD-10-CM | POA: Insufficient documentation

## 2021-07-25 DIAGNOSIS — X501XXA Overexertion from prolonged static or awkward postures, initial encounter: Secondary | ICD-10-CM | POA: Insufficient documentation

## 2021-07-25 DIAGNOSIS — S99912A Unspecified injury of left ankle, initial encounter: Secondary | ICD-10-CM | POA: Diagnosis not present

## 2021-07-25 DIAGNOSIS — Z87891 Personal history of nicotine dependence: Secondary | ICD-10-CM | POA: Diagnosis not present

## 2021-07-25 DIAGNOSIS — M25572 Pain in left ankle and joints of left foot: Secondary | ICD-10-CM | POA: Diagnosis not present

## 2021-07-25 DIAGNOSIS — M7989 Other specified soft tissue disorders: Secondary | ICD-10-CM | POA: Diagnosis not present

## 2021-07-25 HISTORY — DX: Type 2 diabetes mellitus without complications: E11.9

## 2021-07-25 MED ORDER — IBUPROFEN 800 MG PO TABS
800.0000 mg | ORAL_TABLET | Freq: Three times a day (TID) | ORAL | 0 refills | Status: AC
Start: 2021-07-25 — End: ?

## 2021-07-25 NOTE — ED Triage Notes (Signed)
Pt arrives pov with c/o slip in rain this am, c/o L ankle pain and R knee pain. Pt to triage on crutches, endorses inability to bear weight. Pt reports taking oxycodone and meloxicam 2 hrs pta.

## 2021-07-25 NOTE — ED Provider Notes (Signed)
[facility] EMERGENCY DEPT Provider Note   CSN: [id number] Arrival date & time: 07/25/21  1709     History Chief Complaint  Patient presents with   Craig Jordan Craig Jordan. is a 50 y.o. male.  Patient is a 50 year old male with a history of diabetes, hyperlipidemia and ADHD who presents with left ankle pain.  He states that he slipped on a wet ramp earlier today and twisted his left ankle.  He has some constant throbbing pain to his left ankle.  He took one of his wife's oxycodone as well as meloxicam prior to arrival.  He has noted some improvement in symptoms.  He has some abrasions on his right knee but denies any other injuries.      Past Medical History:  Diagnosis Date   ADHD (attention deficit hyperactivity disorder)    Allergy    Diabetes mellitus without complication ([place])    Glaucoma    Hyperlipidemia    Obesity     Patient Active Problem List   Diagnosis Date Noted   Pure hypercholesterolemia 08/29/2016   Glaucoma 02/05/2015   Snoring 02/11/2013   Chest pain, atypical 12/20/2012   ADD (attention deficit disorder) 12/13/2012   Obesity 12/13/2012    Past Surgical History:  Procedure Laterality Date   KNEE SURGERY     arthroscopic -  B meniscus repairs       Family History  Problem Relation Age of Onset   Seizures Father    Epilepsy Father    Arthritis Father        hip OA s/p hip replacement   Sleep apnea Brother     Social History   Tobacco Use   Smoking status: Former    Types: Cigarettes    Quit date: 05/17/1994    Years since quitting: 27.2   Smokeless tobacco: Never  Substance Use Topics   Alcohol use: No    Alcohol/week: 0.0 standard drinks    Comment: "Stopped 2 weeks ago" stated on 07/28/2014   Drug use: Yes    Frequency: 7.0 times per week    Types: Marijuana    Home Medications Prior to Admission medications   Medication Sig Start Date End Date Taking? Authorizing Provider  ibuprofen (ADVIL) 800 MG tablet  Take 1 tablet (800 mg total) by mouth 3 (three) times daily. 07/25/21  Yes Malvin Johns, MD  amphetamine-dextroamphetamine (ADDERALL) 20 MG tablet Take 1 tablet (20 mg total) by mouth 3 (three) times daily. Ok to fill in 30 days 02/13/21   Copland, Gay Filler, MD  amphetamine-dextroamphetamine (ADDERALL) 20 MG tablet Take 1 tablet (20 mg total) by mouth 3 (three) times daily. 02/13/21   Copland, Gay Filler, MD  amphetamine-dextroamphetamine (ADDERALL) 20 MG tablet Take 1 tablet (20 mg total) by mouth 3 (three) times daily. 07/07/21   Copland, Gay Filler, MD  diphenhydrAMINE (BENADRYL) 25 MG tablet Take 25 mg by mouth at bedtime as needed.    [provider]  latanoprost (XALATAN) 0.005 % ophthalmic solution  07/28/19   [provider]  loratadine (CLARITIN) 10 MG tablet Take 10 mg by mouth daily.    [provider]  oxymetazoline (AFRIN) 0.05 % nasal spray Place 2 sprays into the nose at bedtime as needed. For nasal congestion    [provider]  pravastatin (PRAVACHOL) 40 MG tablet TAKE 1 TABLET(40 MG) BY MOUTH DAILY 08/16/20   Copland, Gay Filler, MD    Allergies    Patient has no  known allergies.  Review of Systems   Review of Systems  Constitutional:  Negative for fever.  Gastrointestinal:  Negative for nausea and vomiting.  Musculoskeletal:  Positive for arthralgias and joint swelling. Negative for back pain and neck pain.  Skin:  Positive for wound.  Neurological:  Negative for weakness, numbness and headaches.   Physical Exam Updated Vital Signs BP (!) 147/96 (BP Location: Right Arm)   Pulse 73   Temp 98.8 F (37.1 C) (Oral)   Resp 20   Ht 5' 11"  (1.803 m)   Wt (!) 156 kg   SpO2 99%   BMI 47.98 kg/m   Physical Exam Constitutional:      Appearance: He is well-developed.  HENT:     Head: Normocephalic and atraumatic.  Cardiovascular:     Rate and Rhythm: Normal rate.  Pulmonary:     Effort: Pulmonary effort is normal.  Musculoskeletal:         General: Tenderness present.     Cervical back: Normal range of motion and neck supple.     Comments: Positive tenderness over the lateral malleolus of the left ankle.  There is this area.  The Achilles appears intact.  There is no bony tenderness to the foot.  Pedal pulses are intact.  He has normal sensation and motor function distally.  There is no pain to the left knee.  There are some abrasions over the right knee but no underlying bony tenderness.  Skin:    General: Skin is warm and dry.  Neurological:     Mental Status: He is alert and oriented to person, place, and time.    ED Results / Procedures / Treatments   Labs (all labs ordered are listed, but only abnormal results are displayed) Labs Reviewed - No data to display  EKG None  Radiology DG Ankle Complete Left  Result Date: 07/25/2021 CLINICAL DATA:  Fall, swelling. Slipped in the rain this morning. Left ankle pain. EXAM: LEFT ANKLE COMPLETE - 3+ VIEW COMPARISON:  None. FINDINGS: There is no evidence of fracture, dislocation, or joint effusion. Minimal tibial talar spurring. Intact talar dome. Ankle mortise is preserved. Small plantar calcaneal spur. Generalized soft tissue edema. IMPRESSION: Generalized soft tissue edema. No acute fracture or subluxation. Electronically Signed   By: Keith Rake M.D.   On: 07/25/2021 19:05    Procedures Procedures   Medications Ordered in ED Medications - No data to display  ED Course  I have reviewed the triage vital signs and the nursing notes.  Pertinent labs & imaging results that were available during my care of the patient were reviewed by me and considered in my medical decision making (see chart for details).    MDM Rules/Calculators/A&P                           No fractures identified on imaging studies these were reviewed by me.  He was placed in an ASO.  He has crutches.  He was advised on ice and elevation.  He was given a prescription for 800 mg ibuprofen.  He was  advised to follow-up with his orthopedist if his symptoms are not improving within the next week.  Return precautions were given. Final Clinical Impression(s) / ED Diagnoses Final diagnoses:  Sprain of left ankle, unspecified ligament, initial encounter    Rx / DC Orders ED Discharge Orders          Ordered    ibuprofen (  ADVIL) 800 MG tablet  3 times daily        07/25/21 2057             Malvin Johns, MD 07/25/21 2101

## 2021-08-07 ENCOUNTER — Encounter: Payer: Self-pay | Admitting: Family Medicine

## 2021-08-07 ENCOUNTER — Other Ambulatory Visit: Payer: Self-pay | Admitting: Family Medicine

## 2021-08-07 DIAGNOSIS — F988 Other specified behavioral and emotional disorders with onset usually occurring in childhood and adolescence: Secondary | ICD-10-CM

## 2021-08-07 MED ORDER — AMPHETAMINE-DEXTROAMPHETAMINE 20 MG PO TABS: 20.0000 mg | ORAL_TABLET | Freq: Three times a day (TID) | ORAL | 0 refills | Status: DC

## 2021-08-13 ENCOUNTER — Encounter: Payer: Self-pay | Admitting: Family Medicine

## 2021-08-13 ENCOUNTER — Telehealth: Payer: Self-pay | Admitting: Family Medicine

## 2021-08-13 MED ORDER — AMPHETAMINE-DEXTROAMPHETAMINE 30 MG PO TABS: 30.0000 mg | ORAL_TABLET | Freq: Two times a day (BID) | ORAL | 0 refills | Status: DC

## 2021-08-13 NOTE — Telephone Encounter (Signed)
Please advise 

## 2021-08-13 NOTE — Telephone Encounter (Signed)
Patient states there is a backorder for his Adderal 106m, so he would like for it to be changed to 30 mg so he can continue to take it and not have to mess up his med schedule due to this backorder. If able please send to  [place] [address]  [id number] Phone:  [phone] Fax:  [phone]

## 2021-08-15 NOTE — Progress Notes (Deleted)
[place] at [facility] [address], [place], [place] [address] [place]  Date:  08/19/2021   Name:  Craig Jordan.   DOB:  1971-01-24   MRN:  [id number]  PCP:  Darreld Mclean, MD    Chief Complaint: No chief complaint on file.   History of Present Illness:  Craig Jordan. is a 50 y.o. very pleasant male patient who presents with the following:  Seen today for a periodic follow-up visit-  History of ADD, glaucoma, obesity, hyperlipidemia, prediabetes Last seen by myself in March  He and his wife Craig Jordan recently adopted a baby boy   Flu vaccine Covid  Labs one year ago  Cologuard done 4/22  Taking adderall 20 mg TID Pravastatin   Patient Active Problem List   Diagnosis Date Noted   Pure hypercholesterolemia 08/29/2016   Glaucoma 02/05/2015   Snoring 02/11/2013   Chest pain, atypical 12/20/2012   ADD (attention deficit disorder) 12/13/2012   Obesity 12/13/2012    Past Medical History:  Diagnosis Date   ADHD (attention deficit hyperactivity disorder)    Allergy    Diabetes mellitus without complication ([place])    Glaucoma    Hyperlipidemia    Obesity     Past Surgical History:  Procedure Laterality Date   KNEE SURGERY     arthroscopic -  B meniscus repairs    Social History   Tobacco Use   Smoking status: Former    Types: Cigarettes    Quit date: 05/17/1994    Years since quitting: 27.2   Smokeless tobacco: Never  Substance Use Topics   Alcohol use: No    Alcohol/week: 0.0 standard drinks    Comment: "Stopped 2 weeks ago" stated on 07/28/2014   Drug use: Yes    Frequency: 7.0 times per week    Types: Marijuana    Family History  Problem Relation Age of Onset   Seizures Father    Epilepsy Father    Arthritis Father        hip OA s/p hip replacement   Sleep apnea Brother     No Known Allergies  Medication list has been reviewed and updated.  Current Outpatient Medications  on File Prior to Visit  Medication Sig Dispense Refill   amphetamine-dextroamphetamine (ADDERALL) 20 MG tablet Take 1 tablet (20 mg total) by mouth 3 (three) times daily. 90 tablet 0   amphetamine-dextroamphetamine (ADDERALL) 30 MG tablet Take 1 tablet by mouth 2 (two) times daily. Take am and noon 60 tablet 0   diphenhydrAMINE (BENADRYL) 25 MG tablet Take 25 mg by mouth at bedtime as needed.     ibuprofen (ADVIL) 800 MG tablet Take 1 tablet (800 mg total) by mouth 3 (three) times daily. 21 tablet 0   latanoprost (XALATAN) 0.005 % ophthalmic solution      loratadine (CLARITIN) 10 MG tablet Take 10 mg by mouth daily.     oxymetazoline (AFRIN) 0.05 % nasal spray Place 2 sprays into the nose at bedtime as needed. For nasal congestion     pravastatin (PRAVACHOL) 40 MG tablet TAKE 1 TABLET(40 MG) BY MOUTH DAILY 90 tablet 3   No current facility-administered medications on file prior to visit.    Review of Systems:  As per HPI- otherwise negative.   Physical Examination: There were no vitals filed for this visit. There were no vitals filed for this visit. There is no height or weight on  file to calculate BMI. Ideal Body Weight:    GEN: no acute distress. HEENT: Atraumatic, Normocephalic.  Ears and Nose: No external deformity. CV: RRR, No M/G/R. No JVD. No thrill. No extra heart sounds. PULM: CTA B, no wheezes, crackles, rhonchi. No retractions. No resp. distress. No accessory muscle use. ABD: S, NT, ND, +BS. No rebound. No HSM. EXTR: No c/c/e PSYCH: Normally interactive. Conversant.    Assessment and Plan: ***  Signed Lamar Blinks, MD

## 2021-08-19 ENCOUNTER — Ambulatory Visit: Payer: BC Managed Care – PPO | Admitting: Family Medicine

## 2021-08-19 DIAGNOSIS — Z131 Encounter for screening for diabetes mellitus: Secondary | ICD-10-CM

## 2021-08-19 DIAGNOSIS — F988 Other specified behavioral and emotional disorders with onset usually occurring in childhood and adolescence: Secondary | ICD-10-CM

## 2021-08-19 DIAGNOSIS — E785 Hyperlipidemia, unspecified: Secondary | ICD-10-CM

## 2021-08-19 DIAGNOSIS — Z130 Encounter for screening for diseases of the blood and blood-forming organs and certain disorders involving the immune mechanism: Secondary | ICD-10-CM

## 2021-08-19 DIAGNOSIS — Z125 Encounter for screening for malignant neoplasm of prostate: Secondary | ICD-10-CM

## 2021-09-03 ENCOUNTER — Other Ambulatory Visit: Payer: Self-pay | Admitting: Family Medicine

## 2021-09-03 DIAGNOSIS — E7800 Pure hypercholesterolemia, unspecified: Secondary | ICD-10-CM

## 2021-10-15 NOTE — Progress Notes (Addendum)
[place] at [facility] [address] [place], [place] [address] [place]  Date:  10/17/2021   Name:  Craig Jordan.   DOB:  [birth year]   MRN:  [id number]  PCP:  Darreld Mclean, MD    Chief Complaint: Sinus Problem (Here for Follow up ) and Annual Exam (Complains of sinus problem)   History of Present Illness:  Craig Jordan. is a 50 y.o. very pleasant male patient who presents with the following:  Patient seen today for physical exam- History of ADD, glaucoma, obesity, hyperlipidemia, prediabetes Most recent visit with myself was in March of this year  He and his wife adopted a baby boy over the summer- he is now 12 months old and doing great  However, their schedule and also sleeping are fairly hectic.  Craig Jordan notes he has gained a fair amount of weight which is disheartening to him.  He is worried about obesity and associated health risks   Wt Readings from Last 3 Encounters:  10/17/21 (!) 355 lb 9.6 oz (161.3 kg)  07/25/21 (!) 344 lb (156 kg)  02/13/21 (!) 331 lb (150.1 kg)   No contracaindication for GLP-1-no history of pancreatitis, no personal or family history of thyroid cancer or multiple endocrine neoplasia syndrome.  She is interested in trying GLP-1 for weight loss  He also notes sinus congestion persistently for about 6 months.  He reports that his dentist took x-rays and saw opacification of the sinuses  Negative Cologuard this year Flu vaccine- will give today  COVID booster-recommended Tetanus is up-to-date Labs due for update  Taking Adderall 20 mg 3 times daily for ADHD-most recent refill October 6 He is happy with his regimen, no side effects.  He is able to sleep well.  He typically takes 40 mg first thing in the morning and 20 mg at lunch Patient Active Problem List   Diagnosis Date Noted   Pure hypercholesterolemia 08/29/2016   Glaucoma 02/05/2015   Snoring 02/11/2013   Chest pain,  atypical 12/20/2012   ADD (attention deficit disorder) 12/13/2012   Obesity 12/13/2012    Past Medical History:  Diagnosis Date   ADHD (attention deficit hyperactivity disorder)    Allergy    Diabetes mellitus without complication ([place])    Glaucoma    Hyperlipidemia    Obesity     Past Surgical History:  Procedure Laterality Date   KNEE SURGERY     arthroscopic -  B meniscus repairs    Social History   Tobacco Use   Smoking status: Former    Types: Cigarettes    Quit date: 05/17/1994    Years since quitting: 27.4   Smokeless tobacco: Never  Substance Use Topics   Alcohol use: No    Alcohol/week: 0.0 standard drinks    Comment: "Stopped 2 weeks ago" stated on 07/28/2014   Drug use: Yes    Frequency: 7.0 times per week    Types: Marijuana    Family History  Problem Relation Age of Onset   Seizures Father    Epilepsy Father    Arthritis Father        hip OA s/p hip replacement   Sleep apnea Brother     No Known Allergies  Medication list has been reviewed and updated.  Current Outpatient Medications on File Prior to Visit  Medication Sig Dispense Refill   amphetamine-dextroamphetamine (ADDERALL) 20 MG tablet Take 1 tablet (20 mg  total) by mouth 3 (three) times daily. 90 tablet 0   amphetamine-dextroamphetamine (ADDERALL) 30 MG tablet Take 1 tablet by mouth 2 (two) times daily. Take am and noon 60 tablet 0   diphenhydrAMINE (BENADRYL) 25 MG tablet Take 25 mg by mouth at bedtime as needed.     ibuprofen (ADVIL) 800 MG tablet Take 1 tablet (800 mg total) by mouth 3 (three) times daily. 21 tablet 0   latanoprost (XALATAN) 0.005 % ophthalmic solution      loratadine (CLARITIN) 10 MG tablet Take 10 mg by mouth daily.     oxymetazoline (AFRIN) 0.05 % nasal spray Place 2 sprays into the nose at bedtime as needed. For nasal congestion     pravastatin (PRAVACHOL) 40 MG tablet TAKE 1 TABLET(40 MG) BY MOUTH DAILY 90 tablet 0   No current facility-administered medications  on file prior to visit.    Review of Systems:  As per HPI- otherwise negative.   Physical Examination: Vitals:   10/17/21 1355  BP: (!) 145/82  Pulse: 94  Resp: 16  Temp: 98 F (36.7 C)  SpO2: 98%   Vitals:   10/17/21 1355  Weight: (!) 355 lb 9.6 oz (161.3 kg)  Height: 5' 11"  (1.803 m)   Body mass index is 49.6 kg/m. Ideal Body Weight: Weight in (lb) to have BMI = 25: 178.9  GEN: no acute distress.  Obese, otherwise looks well HEENT: Atraumatic, Normocephalic. Net IO: No IO data has been entered for this period [10/17/21 1943] Ears and Nose: No external deformity. CV: RRR, No M/G/R. No JVD. No thrill. No extra heart sounds. PULM: CTA B, no wheezes, crackles, rhonchi. No retractions. No resp. distress. No accessory muscle use. ABD: S, NT, ND, +BS. No rebound. No HSM. EXTR: No c/c/e PSYCH: Normally interactive. Conversant.    Assessment and Plan: Physical exam  Attention deficit disorder (ADD) without hyperactivity - Plan: amphetamine-dextroamphetamine (ADDERALL) 20 MG tablet, amphetamine-dextroamphetamine (ADDERALL) 20 MG tablet, amphetamine-dextroamphetamine (ADDERALL) 20 MG tablet  Hyperlipidemia, unspecified hyperlipidemia type - Plan: Lipid panel  Screening for diabetes mellitus - Plan: Comprehensive metabolic panel, Hemoglobin A1c  Screening for deficiency anemia - Plan: CBC  Screening for prostate cancer - Plan: PSA  Class 3 severe obesity due to excess calories with serious comorbidity and body mass index (BMI) of 40.0 to 44.9 in adult ([place]) - Plan: Liraglutide -Weight Management (SAXENDA) 18 MG/3ML SOPN, Insulin Pen Needle (PEN NEEDLES) 32G X 5 MM MISC  Sinus congestion - Plan: amoxicillin (AMOXIL) 500 MG capsule  Need for influenza vaccination - Plan: Flu Vaccine QUAD 6+ mos PF IM (Fluarix Quad PF)  Patient seen today for physical exam.  Encouraged healthy diet and exercise routine Gave flu shot, routine labs are pending as above Refilled Adderall for  3 months.  Patient will continue routine of 20 mg 3 times a day, takes 41st thing and 20 mg at lunch He notes sinus congestion for several months and apparent sinus opacification on dental films.  We will try a course of amoxicillin.  He will let me know if symptoms do not remit  He would like to try Simpson for weight loss.  He feels comfortable giving an injection at home.  Prescribed medication and needles, educated regarding titration.  Most likely will need a prior authorization Will plan further follow- up pending labs.   Signed Lamar Blinks, MD  Addendum 12/2, received labs as below.  Message to patient  Results for orders placed or performed in visit on 10/17/21  CBC  Result Value Ref Range   WBC 6.0 4.0 - 10.5 K/uL   RBC 4.97 4.22 - 5.81 Mil/uL   Platelets 241.0 150.0 - 400.0 K/uL   Hemoglobin 14.1 13.0 - 17.0 g/dL   HCT 41.8 39.0 - 52.0 %   MCV 84.2 78.0 - 100.0 fl   MCHC 33.7 30.0 - 36.0 g/dL   RDW 13.8 11.5 - 15.5 %  Comprehensive metabolic panel  Result Value Ref Range   Sodium 139 135 - 145 mEq/L   Potassium 3.9 3.5 - 5.1 mEq/L   Chloride 102 96 - 112 mEq/L   CO2 27 19 - 32 mEq/L   Glucose, Bld 81 70 - 99 mg/dL   BUN 18 6 - 23 mg/dL   Creatinine, Ser 1.01 0.40 - 1.50 mg/dL   Total Bilirubin 0.4 0.2 - 1.2 mg/dL   Alkaline Phosphatase 83 39 - 117 U/L   AST 17 0 - 37 U/L   ALT 29 0 - 53 U/L   Total Protein 6.8 6.0 - 8.3 g/dL   Albumin 4.2 3.5 - 5.2 g/dL   GFR 87.64 >60.00 mL/min   Calcium 9.6 8.4 - 10.5 mg/dL  Hemoglobin A1c  Result Value Ref Range   Hgb A1c MFr Bld 5.8 4.6 - 6.5 %  Lipid panel  Result Value Ref Range   Cholesterol 172 0 - 200 mg/dL   Triglycerides 164.0 (H) 0.0 - 149.0 mg/dL   HDL 56.80 >39.00 mg/dL   VLDL 32.8 0.0 - 40.0 mg/dL   LDL Cholesterol 82 0 - 99 mg/dL   Total CHOL/HDL Ratio 3    NonHDL 115.15   PSA  Result Value Ref Range   PSA 0.48 0.10 - 4.00 ng/mL

## 2021-10-15 NOTE — Patient Instructions (Addendum)
It was good to see you again today, I will be in touch with your labs as soon as possible  For Saxenda-  Start with 0.6 mg daily for one week Then 1.2 mg daily for one week Then 1.8 mg for one week Then 2.4 mg for one week 3 mg daily goal dose   Use amoxicillin for 10 days for possible sinus infection- let me know if this does not improve your sinus symptoms Please get the latest covid booster at your convenience   Continue current dose of adderall

## 2021-10-17 ENCOUNTER — Ambulatory Visit (INDEPENDENT_AMBULATORY_CARE_PROVIDER_SITE_OTHER): Payer: BC Managed Care – PPO | Admitting: Family Medicine

## 2021-10-17 ENCOUNTER — Encounter: Payer: Self-pay | Admitting: Family Medicine

## 2021-10-17 VITALS — BP 145/82 | HR 94 | Temp 98.0°F | Resp 16 | Ht 71.0 in | Wt 355.6 lb

## 2021-10-17 DIAGNOSIS — Z23 Encounter for immunization: Secondary | ICD-10-CM

## 2021-10-17 DIAGNOSIS — Z125 Encounter for screening for malignant neoplasm of prostate: Secondary | ICD-10-CM

## 2021-10-17 DIAGNOSIS — R0981 Nasal congestion: Secondary | ICD-10-CM

## 2021-10-17 DIAGNOSIS — Z131 Encounter for screening for diabetes mellitus: Secondary | ICD-10-CM

## 2021-10-17 DIAGNOSIS — E785 Hyperlipidemia, unspecified: Secondary | ICD-10-CM | POA: Diagnosis not present

## 2021-10-17 DIAGNOSIS — Z0000 Encounter for general adult medical examination without abnormal findings: Secondary | ICD-10-CM

## 2021-10-17 DIAGNOSIS — F988 Other specified behavioral and emotional disorders with onset usually occurring in childhood and adolescence: Secondary | ICD-10-CM

## 2021-10-17 DIAGNOSIS — Z130 Encounter for screening for diseases of the blood and blood-forming organs and certain disorders involving the immune mechanism: Secondary | ICD-10-CM

## 2021-10-17 DIAGNOSIS — Z6841 Body Mass Index (BMI) 40.0 and over, adult: Secondary | ICD-10-CM

## 2021-10-17 MED ORDER — SAXENDA 18 MG/3ML ~~LOC~~ SOPN: 3.0000 mg | PEN_INJECTOR | Freq: Every day | SUBCUTANEOUS | 6 refills | Status: DC

## 2021-10-17 MED ORDER — AMPHETAMINE-DEXTROAMPHETAMINE 20 MG PO TABS: 20.0000 mg | ORAL_TABLET | Freq: Three times a day (TID) | ORAL | 0 refills | Status: DC

## 2021-10-17 MED ORDER — PEN NEEDLES 32G X 5 MM MISC: 3 refills | Status: DC

## 2021-10-17 MED ORDER — AMPHETAMINE-DEXTROAMPHETAMINE 20 MG PO TABS
20.0000 mg | ORAL_TABLET | Freq: Three times a day (TID) | ORAL | 0 refills | Status: DC
Start: 2021-10-17 — End: 2022-01-21

## 2021-10-17 MED ORDER — AMOXICILLIN 500 MG PO CAPS: 1000.0000 mg | ORAL_CAPSULE | Freq: Two times a day (BID) | ORAL | 0 refills | Status: DC

## 2021-10-18 ENCOUNTER — Encounter: Payer: Self-pay | Admitting: Family Medicine

## 2021-10-18 ENCOUNTER — Telehealth: Payer: Self-pay | Admitting: Family Medicine

## 2021-10-18 LAB — PSA: PSA: 0.48 ng/mL (ref 0.10–4.00)

## 2021-10-18 LAB — LIPID PANEL
Cholesterol: 172 mg/dL (ref 0–200)
HDL: 56.8 mg/dL (ref >39.00)
LDL Cholesterol: 82 mg/dL (ref 0–99)
NonHDL: 115.15
Total CHOL/HDL Ratio: 3
Triglycerides: 164 mg/dL — ABNORMAL HIGH (ref 0.0–149.0)
VLDL: 32.8 mg/dL (ref 0.0–40.0)

## 2021-10-18 LAB — HEMOGLOBIN A1C: Hgb A1c MFr Bld: 5.8 % (ref 4.6–6.5)

## 2021-10-18 LAB — CBC
HCT: 41.8 % (ref 39.0–52.0)
Hemoglobin: 14.1 g/dL (ref 13.0–17.0)
MCHC: 33.7 g/dL (ref 30.0–36.0)
MCV: 84.2 fl (ref 78.0–100.0)
Platelets: 241 10*3/uL (ref 150.0–400.0)
RBC: 4.97 Mil/uL (ref 4.22–5.81)
RDW: 13.8 % (ref 11.5–15.5)
WBC: 6 10*3/uL (ref 4.0–10.5)

## 2021-10-18 LAB — COMPREHENSIVE METABOLIC PANEL
ALT: 29 U/L (ref 0–53)
AST: 17 U/L (ref 0–37)
Albumin: 4.2 g/dL (ref 3.5–5.2)
Alkaline Phosphatase: 83 U/L (ref 39–117)
BUN: 18 mg/dL (ref 6–23)
CO2: 27 mEq/L (ref 19–32)
Calcium: 9.6 mg/dL (ref 8.4–10.5)
Chloride: 102 mEq/L (ref 96–112)
Creatinine, Ser: 1.01 mg/dL (ref 0.40–1.50)
GFR: 87.64 mL/min (ref >60.00)
Glucose, Bld: 81 mg/dL (ref 70–99)
Potassium: 3.9 mEq/L (ref 3.5–5.1)
Sodium: 139 mEq/L (ref 135–145)
Total Bilirubin: 0.4 mg/dL (ref 0.2–1.2)
Total Protein: 6.8 g/dL (ref 6.0–8.3)

## 2021-10-18 NOTE — Telephone Encounter (Signed)
I have not gotten a PA request.

## 2021-10-18 NOTE — Telephone Encounter (Signed)
Sometimes both, I can start one.

## 2021-10-18 NOTE — Telephone Encounter (Signed)
Patient wants to make Dr Lorelei Pont aware that his insurance did not approve his Liraglutide -Weight Management (SAXENDA) 18 MG/3ML SOPN  medication Please advice.

## 2021-10-21 ENCOUNTER — Encounter: Payer: Self-pay | Admitting: Family Medicine

## 2021-10-21 NOTE — Telephone Encounter (Signed)
PA initiated via CoverMyMeds Key: [id number]

## 2021-10-23 ENCOUNTER — Encounter: Payer: Self-pay | Admitting: Family Medicine

## 2021-10-30 NOTE — Telephone Encounter (Signed)
[place], Gay Filler, MD to Truddie Coco.    [place] am afraid [employer] has firmly denied your Saxenda rx . The paperwork I got states they do not cover any treatment except for weight loss surgery.  I do think weight loss surgery would likely benefit you in the years to come. Let me know if this is something you would like to look into and I can help you   Take care [place]  Last read by Truddie Coco. at 11:02 AM on 10/23/2021.

## 2021-11-07 DIAGNOSIS — H401122 Primary open-angle glaucoma, left eye, moderate stage: Secondary | ICD-10-CM | POA: Diagnosis not present

## 2021-11-07 DIAGNOSIS — H5213 Myopia, bilateral: Secondary | ICD-10-CM | POA: Diagnosis not present

## 2021-11-12 ENCOUNTER — Telehealth: Payer: Self-pay | Admitting: Family Medicine

## 2021-11-12 DIAGNOSIS — R0981 Nasal congestion: Secondary | ICD-10-CM

## 2021-11-12 NOTE — Telephone Encounter (Signed)
Rx was sent on 11/06/21 for Craig Jordan. Okay for Rx to be called in presumptively for the pt?

## 2021-11-12 NOTE — Telephone Encounter (Signed)
Pt was requesting tamiflu that his wife was prescribed since he is now sick. Please advise.

## 2021-11-13 MED ORDER — OSELTAMIVIR PHOSPHATE 75 MG PO CAPS: 75.0000 mg | ORAL_CAPSULE | Freq: Two times a day (BID) | ORAL | 0 refills | Status: AC

## 2021-11-13 NOTE — Telephone Encounter (Signed)
Disregard.   Left vm to return call.

## 2021-11-13 NOTE — Telephone Encounter (Signed)
Pt aware and voices understanding.   

## 2021-11-13 NOTE — Telephone Encounter (Addendum)
Pt called back in states that sore throat and nasal congestion started about 2 days ago. Medication sent in

## 2021-12-02 ENCOUNTER — Other Ambulatory Visit: Payer: Self-pay | Admitting: Family Medicine

## 2021-12-02 DIAGNOSIS — E7800 Pure hypercholesterolemia, unspecified: Secondary | ICD-10-CM

## 2021-12-13 ENCOUNTER — Encounter: Payer: Self-pay | Admitting: Family Medicine

## 2021-12-13 ENCOUNTER — Telehealth: Payer: Self-pay | Admitting: Family Medicine

## 2021-12-13 DIAGNOSIS — U071 COVID-19: Secondary | ICD-10-CM

## 2021-12-13 MED ORDER — AMPHETAMINE-DEXTROAMPHETAMINE 30 MG PO TABS: 30.0000 mg | ORAL_TABLET | Freq: Two times a day (BID) | ORAL | 0 refills | Status: DC

## 2021-12-13 NOTE — Telephone Encounter (Signed)
[facility] has a shortage on Adderall 20 mg. Please advise.

## 2021-12-13 NOTE — Telephone Encounter (Signed)
Pt's pharmacy does not have his 18m adderall in, but [facility] he was wondering if he could switch until his normal rx is back in stock. Please advise.   Medication: adderall 3038m Has the patient contacted their pharmacy? Yes.     Preferred Pharmacy:  [facility] [id number] [phone]

## 2021-12-22 MED ORDER — MOLNUPIRAVIR EUA 200MG CAPSULE: 4.0000 | ORAL_CAPSULE | Freq: Two times a day (BID) | ORAL | 0 refills | Status: AC

## 2022-01-21 ENCOUNTER — Telehealth: Payer: Self-pay | Admitting: Family Medicine

## 2022-01-21 DIAGNOSIS — F988 Other specified behavioral and emotional disorders with onset usually occurring in childhood and adolescence: Secondary | ICD-10-CM

## 2022-01-21 MED ORDER — AMPHETAMINE-DEXTROAMPHETAMINE 20 MG PO TABS: 20.0000 mg | ORAL_TABLET | Freq: Three times a day (TID) | ORAL | 0 refills | Status: DC

## 2022-01-21 NOTE — Telephone Encounter (Signed)
Pt was calling to see if he is due for a refill, as he will be leaving town in two weeks and will run out of rx. Please advise ? ? ?Medication: amphetamine-dextroamphetamine (ADDERALL) 20 MG tablet  ? ?Has the patient contacted their pharmacy? No. ? ? ?Preferred Pharmacy: [facility], [place] - [address]  ?[address], [place] [address]  ?Phone:  [phone]  Fax:  [phone]  ? ? ?

## 2022-01-21 NOTE — Telephone Encounter (Signed)
Please see below.

## 2022-03-02 ENCOUNTER — Other Ambulatory Visit: Payer: Self-pay | Admitting: Family Medicine

## 2022-03-02 DIAGNOSIS — E7800 Pure hypercholesterolemia, unspecified: Secondary | ICD-10-CM

## 2022-04-14 ENCOUNTER — Other Ambulatory Visit: Payer: Self-pay | Admitting: Family Medicine

## 2022-04-14 DIAGNOSIS — U071 COVID-19: Secondary | ICD-10-CM

## 2022-04-15 NOTE — Patient Instructions (Incomplete)
It was great to see you again today, please see me in about 6 months

## 2022-04-15 NOTE — Progress Notes (Deleted)
[place] at [facility] [address] [place], [place] [address] [place]  Date:  04/17/2022   Name:  Craig Jordan.   DOB:  [birth year]   MRN:  [id number]  PCP:  Darreld Mclean, MD    Chief Complaint: No chief complaint on file.   History of Present Illness:  Craig Jordan. is a 51 y.o. very pleasant male patient who presents with the following:  Patient seen today for medication follow-up- History of ADD, glaucoma, obesity, hyperlipidemia, prediabetes Most recent visit with myself was in December.  At that time his baby boy was 74 months old and doing great Craig Jordan had unfortunately gained some weight at our last visit-we tried to prescribe a GLP-1 but this was denied by his insurance.  He continues to take Adderall 20 mg 3 times daily-40 mg first thing in the morning, 20 mg at lunchtime  Lab work done in December, looks reasonable.  A1c 5.8% Can do lab work today if he would like PMP D is reviewed 5/30, most recent Adderall refill 4/12 Patient Active Problem List   Diagnosis Date Noted   Pure hypercholesterolemia 08/29/2016   Glaucoma 02/05/2015   Snoring 02/11/2013   Chest pain, atypical 12/20/2012   ADD (attention deficit disorder) 12/13/2012   Obesity 12/13/2012    Past Medical History:  Diagnosis Date   ADHD (attention deficit hyperactivity disorder)    Allergy    Diabetes mellitus without complication ([place])    Glaucoma    Hyperlipidemia    Obesity     Past Surgical History:  Procedure Laterality Date   KNEE SURGERY     arthroscopic -  B meniscus repairs    Social History   Tobacco Use   Smoking status: Former    Types: Cigarettes    Quit date: 05/17/1994    Years since quitting: 27.9   Smokeless tobacco: Never  Substance Use Topics   Alcohol use: No    Alcohol/week: 0.0 standard drinks    Comment: "Stopped 2 weeks ago" stated on 07/28/2014   Drug use: Yes    Frequency: 7.0 times per  week    Types: Marijuana    Family History  Problem Relation Age of Onset   Seizures Father    Epilepsy Father    Arthritis Father        hip OA s/p hip replacement   Sleep apnea Brother     No Known Allergies  Medication list has been reviewed and updated.  Current Outpatient Medications on File Prior to Visit  Medication Sig Dispense Refill   amoxicillin (AMOXIL) 500 MG capsule Take 2 capsules (1,000 mg total) by mouth 2 (two) times daily. 40 capsule 0   amphetamine-dextroamphetamine (ADDERALL) 20 MG tablet Take 1 tablet (20 mg total) by mouth 3 (three) times daily. 90 tablet 0   amphetamine-dextroamphetamine (ADDERALL) 20 MG tablet Take 1 tablet (20 mg total) by mouth 3 (three) times daily. 90 tablet 0   amphetamine-dextroamphetamine (ADDERALL) 20 MG tablet Take 1 tablet (20 mg total) by mouth 3 (three) times daily. To fill in 30 days 90 tablet 0   amphetamine-dextroamphetamine (ADDERALL) 20 MG tablet Take 1 tablet (20 mg total) by mouth in the morning, at noon, and at bedtime. To fill in 60 days 90 tablet 0   amphetamine-dextroamphetamine (ADDERALL) 30 MG tablet Take 1 tablet by mouth 2 (two) times daily. 60 tablet 0   diphenhydrAMINE (BENADRYL) 25  MG tablet Take 25 mg by mouth at bedtime as needed.     ibuprofen (ADVIL) 800 MG tablet Take 1 tablet (800 mg total) by mouth 3 (three) times daily. 21 tablet 0   Insulin Pen Needle (PEN NEEDLES) 32G X 5 MM MISC Use one daily as needed for injection 100 each 3   latanoprost (XALATAN) 0.005 % ophthalmic solution      Liraglutide -Weight Management (SAXENDA) 18 MG/3ML SOPN Inject 3 mg into the skin daily. Start with 0.6 mg and titrate to 48m  as directed 15 mL 6   loratadine (CLARITIN) 10 MG tablet Take 10 mg by mouth daily.     oxymetazoline (AFRIN) 0.05 % nasal spray Place 2 sprays into the nose at bedtime as needed. For nasal congestion     pravastatin (PRAVACHOL) 40 MG tablet TAKE 1 TABLET(40 MG) BY MOUTH DAILY 90 tablet 0   No  current facility-administered medications on file prior to visit.    Review of Systems:  As per HPI- otherwise negative.   Physical Examination: There were no vitals filed for this visit. There were no vitals filed for this visit. There is no height or weight on file to calculate BMI. Ideal Body Weight:    GEN: no acute distress. HEENT: Atraumatic, Normocephalic.  Ears and Nose: No external deformity. CV: RRR, No M/G/R. No JVD. No thrill. No extra heart sounds. PULM: CTA B, no wheezes, crackles, rhonchi. No retractions. No resp. distress. No accessory muscle use. ABD: S, NT, ND, +BS. No rebound. No HSM. EXTR: No c/c/e PSYCH: Normally interactive. Conversant.    Assessment and Plan: ***  Signed JLamar Blinks MD

## 2022-04-17 ENCOUNTER — Ambulatory Visit: Payer: BC Managed Care – PPO | Admitting: Family Medicine

## 2022-04-17 DIAGNOSIS — F988 Other specified behavioral and emotional disorders with onset usually occurring in childhood and adolescence: Secondary | ICD-10-CM

## 2022-04-24 NOTE — Patient Instructions (Incomplete)
It was great to see you again today, please see me in about 6 months assuming all is well Please call your insurance company and ask if they will cover a GLP-1 agonist for you.  If they will cover one of these drugs I am happy to rx for you  If this is not covered or if not effective we might look at weight loss surgery for you   I also ordered a coronary CT for you- please set this up asap

## 2022-04-24 NOTE — Progress Notes (Unsigned)
[place] at  [facility] [address] [place], [place] [address] [place]  Date:  04/28/2022   Name:  Craig Jordan.   DOB:  1971-03-11   MRN:  [id number]  PCP:  Darreld Mclean, MD    Chief Complaint: medication follow up (Concerns/ questions: none/)   History of Present Illness:  Craig Lukehart. is a 51 y.o. very pleasant male patient who presents with the following:  Patient seen today for medication follow-up Last visit with myself was in December History of ADD, glaucoma, obesity, hyperlipidemia, prediabetes  He and his wife adopted a baby boy last year, he is currently 72 months old and thriving At our last visit we planned to start on Saxenda for weight loss-unfortunately this was denied by his insurance company.  They do potentially cover weight loss surgery We talked about this today.  Craig Jordan wonders if perhaps a different GLP-1 might be covered, he will look into this and let me know what he finds out  COVID-19 booster is up-to-date Cologuard last year Labs done in December, CMP, lipid, CBC, A1c 5.8%, PSA  For ADD, he is taking Adderall 20 3 times daily Pravastatin for lipids He is doing well with aderall- he takes 40 mg at 0500 and 20 mg at lunch  He may have some trouble remembering his lunch dose but otherwise no concerns.  He notes his symptoms are generally well controlled No insomnia  He has used some zyrtec recently for allergies  We discussed doing a coronary calcium score, he would like to pursue this for risk ratification.  No chest pain   Wt Readings from Last 3 Encounters:  04/28/22 (!) 355 lb 6.4 oz (161.2 kg)  10/17/21 (!) 355 lb 9.6 oz (161.3 kg)  07/25/21 (!) 344 lb (156 kg)    Patient Active Problem List   Diagnosis Date Noted   Pure hypercholesterolemia 08/29/2016   Glaucoma 02/05/2015   Snoring 02/11/2013   Chest pain, atypical 12/20/2012   ADD (attention deficit disorder)  12/13/2012   Obesity 12/13/2012    Past Medical History:  Diagnosis Date   ADHD (attention deficit hyperactivity disorder)    Allergy    Diabetes mellitus without complication (Finger)    Glaucoma    Hyperlipidemia    Obesity     Past Surgical History:  Procedure Laterality Date   KNEE SURGERY     arthroscopic -  B meniscus repairs    Social History   Tobacco Use   Smoking status: Former    Types: Cigarettes    Quit date: 05/17/1994    Years since quitting: 27.9   Smokeless tobacco: Never  Substance Use Topics   Alcohol use: No    Alcohol/week: 0.0 standard drinks of alcohol    Comment: "Stopped 2 weeks ago" stated on 07/28/2014   Drug use: Yes    Frequency: 7.0 times per week    Types: Marijuana    Family History  Problem Relation Age of Onset   Seizures Father    Epilepsy Father    Arthritis Father        hip OA s/p hip replacement   Sleep apnea Brother     No Known Allergies  Medication list has been reviewed and updated.  Current Outpatient Medications on File Prior to Visit  Medication Sig Dispense Refill   amphetamine-dextroamphetamine (ADDERALL) 20 MG tablet Take 1 tablet (20 mg total) by mouth 3 (three)  times daily. 90 tablet 0   diphenhydrAMINE (BENADRYL) 25 MG tablet Take 25 mg by mouth at bedtime as needed.     ibuprofen (ADVIL) 800 MG tablet Take 1 tablet (800 mg total) by mouth 3 (three) times daily. 21 tablet 0   latanoprost (XALATAN) 0.005 % ophthalmic solution      loratadine (CLARITIN) 10 MG tablet Take 10 mg by mouth daily.     oxymetazoline (AFRIN) 0.05 % nasal spray Place 2 sprays into the nose at bedtime as needed. For nasal congestion     No current facility-administered medications on file prior to visit.    Review of Systems:  As per HPI- otherwise negative.   Physical Examination: Vitals:   04/28/22 1043  BP: 124/80  Pulse: 86  Resp: 18  Temp: 98.5 F (36.9 C)  SpO2: 95%   Vitals:   04/28/22 1043  Weight: (!) 355 lb 6.4  oz (161.2 kg)  Height: 5' 11"  (1.803 m)   Body mass index is 49.57 kg/m. Ideal Body Weight: Weight in (lb) to have BMI = 25: 178.9  GEN: no acute distress.  Obese, looks well HEENT: Atraumatic, Normocephalic.  Ears and Nose: No external deformity. CV: RRR, No M/G/R. No JVD. No thrill. No extra heart sounds. PULM: CTA B, no wheezes, crackles, rhonchi. No retractions. No resp. distress. No accessory muscle use. ABD: S, NT, ND, +BS. No rebound. No HSM. EXTR: No c/c/e PSYCH: Normally interactive. Conversant.    Assessment and Plan: Attention deficit disorder (ADD) without hyperactivity - Plan: amphetamine-dextroamphetamine (ADDERALL) 20 MG tablet, amphetamine-dextroamphetamine (ADDERALL) 20 MG tablet, amphetamine-dextroamphetamine (ADDERALL) 20 MG tablet  Pure hypercholesterolemia - Plan: pravastatin (PRAVACHOL) 40 MG tablet, CT CARDIAC SCORING (SELF PAY ONLY)  Prediabetes - Plan: Basic metabolic panel, Hemoglobin A1c  Class 3 severe obesity due to excess calories with serious comorbidity and body mass index (BMI) of 40.0 to 44.9 in adult [facility])   Following up today.  Labs are pending as above.  Refilled Adderall for 3 months Obesity and hyperlipidemia are present.  Ordered CT coronary calcium score for patient today He will let me know if a different GLP-1 might be an option for him Plan to recheck in 6 months assuming all is well  Signed Lamar Blinks, MD  Received labs as below, message to patient  Results for orders placed or performed in visit on 11/09/81  Basic metabolic panel  Result Value Ref Range   Sodium 138 135 - 145 mEq/L   Potassium 4.4 3.5 - 5.1 mEq/L   Chloride 104 96 - 112 mEq/L   CO2 27 19 - 32 mEq/L   Glucose, Bld 96 70 - 99 mg/dL   BUN 16 6 - 23 mg/dL   Creatinine, Ser 1.26 0.40 - 1.50 mg/dL   GFR 66.96 >60.00 mL/min   Calcium 9.3 8.4 - 10.5 mg/dL  Hemoglobin A1c  Result Value Ref Range   Hgb A1c MFr Bld 5.8 4.6 - 6.5 %

## 2022-04-28 ENCOUNTER — Encounter: Payer: Self-pay | Admitting: Family Medicine

## 2022-04-28 ENCOUNTER — Ambulatory Visit (INDEPENDENT_AMBULATORY_CARE_PROVIDER_SITE_OTHER): Payer: BC Managed Care – PPO | Admitting: Family Medicine

## 2022-04-28 VITALS — BP 124/80 | HR 86 | Temp 98.5°F | Resp 18 | Ht 71.0 in | Wt 355.4 lb

## 2022-04-28 DIAGNOSIS — E7800 Pure hypercholesterolemia, unspecified: Secondary | ICD-10-CM

## 2022-04-28 DIAGNOSIS — F988 Other specified behavioral and emotional disorders with onset usually occurring in childhood and adolescence: Secondary | ICD-10-CM | POA: Diagnosis not present

## 2022-04-28 DIAGNOSIS — R7303 Prediabetes: Secondary | ICD-10-CM | POA: Insufficient documentation

## 2022-04-28 DIAGNOSIS — Z6841 Body Mass Index (BMI) 40.0 and over, adult: Secondary | ICD-10-CM

## 2022-04-28 LAB — BASIC METABOLIC PANEL
BUN: 16 mg/dL (ref 6–23)
CO2: 27 mEq/L (ref 19–32)
Calcium: 9.3 mg/dL (ref 8.4–10.5)
Chloride: 104 mEq/L (ref 96–112)
Creatinine, Ser: 1.26 mg/dL (ref 0.40–1.50)
GFR: 66.96 mL/min (ref >60.00)
Glucose, Bld: 96 mg/dL (ref 70–99)
Potassium: 4.4 mEq/L (ref 3.5–5.1)
Sodium: 138 mEq/L (ref 135–145)

## 2022-04-28 LAB — HEMOGLOBIN A1C: Hgb A1c MFr Bld: 5.8 % (ref 4.6–6.5)

## 2022-04-28 MED ORDER — AMPHETAMINE-DEXTROAMPHETAMINE 20 MG PO TABS: 20.0000 mg | ORAL_TABLET | Freq: Three times a day (TID) | ORAL | 0 refills | Status: DC

## 2022-04-28 MED ORDER — PRAVASTATIN SODIUM 40 MG PO TABS: ORAL_TABLET | ORAL | 3 refills | Status: DC

## 2022-04-29 ENCOUNTER — Encounter: Payer: Self-pay | Admitting: Family Medicine

## 2022-04-29 ENCOUNTER — Ambulatory Visit (HOSPITAL_BASED_OUTPATIENT_CLINIC_OR_DEPARTMENT_OTHER)
Admission: RE | Admit: 2022-04-29 | Discharge: 2022-04-29 | Disposition: A | Discharge status: Home / Self Care | Payer: BC Managed Care – PPO | Source: Ambulatory Visit | Attending: Family Medicine | Admitting: Family Medicine

## 2022-04-29 DIAGNOSIS — E7800 Pure hypercholesterolemia, unspecified: Secondary | ICD-10-CM | POA: Insufficient documentation

## 2022-07-28 ENCOUNTER — Telehealth: Payer: Self-pay | Admitting: Family Medicine

## 2022-07-28 DIAGNOSIS — F988 Other specified behavioral and emotional disorders with onset usually occurring in childhood and adolescence: Secondary | ICD-10-CM

## 2022-07-28 MED ORDER — AMPHETAMINE-DEXTROAMPHETAMINE 20 MG PO TABS: 20.0000 mg | ORAL_TABLET | Freq: Three times a day (TID) | ORAL | 0 refills | Status: DC

## 2022-07-28 NOTE — Telephone Encounter (Signed)
Okay to refill? 

## 2022-07-28 NOTE — Telephone Encounter (Signed)
Medication: amphetamine-dextroamphetamine (ADDERALL) 20 MG tablet   Has the patient contacted their pharmacy? No.   Preferred Pharmacy:  [id number] - [place], [place] - [address] [place] [address] [facility] [address]   [address] [place], [place] [address]  Phone:  [phone]  Fax:  [phone]

## 2022-08-06 MED ORDER — AMPHETAMINE-DEXTROAMPHETAMINE 30 MG PO TABS: 30.0000 mg | ORAL_TABLET | Freq: Two times a day (BID) | ORAL | 0 refills | Status: AC

## 2022-08-06 NOTE — Telephone Encounter (Signed)
See below

## 2022-08-06 NOTE — Addendum Note (Signed)
Addended by: Lamar Blinks C on: 08/06/2022 07:54 PM   Modules accepted: Orders

## 2022-08-06 NOTE — Telephone Encounter (Signed)
Patient called to advise that his pharmacy is out of the amphetamine-dextroamphetamine Adderall 20 mg and they have enough of the 30 mg to cover his prescription if Dr. Lorelei Pont can send in new prescription to :  [facility] [place], [place] - [address]   [address] [place], [place] [address]  Phone:  [phone]  Fax:  [phone]    Patient asked if it is possible to send in 30 mg prescription whenever this happens in the future to avoid some of the phone calls. Please call to advise.

## 2022-08-06 NOTE — Telephone Encounter (Signed)
Received message, this is ok.  Will send rx for 30 mg BID (he typically takes 20 mg TID) to [facility]- please give him a call back.  It is certainly fine with me to substitute 2 x 30 mg Adderall for his typical 3 x 20 mg Adderall when needed.  I would suggest using MyChart when he needs to communicate this to me, this is much easier typically than using the phone!

## 2022-08-07 NOTE — Telephone Encounter (Signed)
Pt is aware.  

## 2022-08-29 ENCOUNTER — Telehealth (INDEPENDENT_AMBULATORY_CARE_PROVIDER_SITE_OTHER): Payer: BC Managed Care – PPO | Admitting: Family Medicine

## 2022-08-29 ENCOUNTER — Encounter: Payer: Self-pay | Admitting: Family Medicine

## 2022-08-29 DIAGNOSIS — J329 Chronic sinusitis, unspecified: Secondary | ICD-10-CM

## 2022-08-29 MED ORDER — AZELASTINE HCL 0.1 % NA SOLN: 2.0000 | Freq: Two times a day (BID) | NASAL | 12 refills | Status: DC

## 2022-08-29 MED ORDER — AMOXICILLIN-POT CLAVULANATE 875-125 MG PO TABS: 1.0000 | ORAL_TABLET | Freq: Two times a day (BID) | ORAL | 0 refills | Status: DC

## 2022-08-29 MED ORDER — BENZONATATE 200 MG PO CAPS: 200.0000 mg | ORAL_CAPSULE | Freq: Two times a day (BID) | ORAL | 0 refills | Status: DC | PRN

## 2022-08-29 NOTE — Progress Notes (Signed)
   Craig Schaumburg. is a 51 y.o. male who presents today for a virtual office visit.  Assessment/Plan:  Sinusitis No red flags.  Given length of symptoms will start Augmentin.  Also start Astelin and Tessalon.  Encouraged hydration.  We discussed reasons return to care.  Follow-up as needed.    Subjective:  HPI:  Patient here with cough and congestion.  Son and wife have been been with similar symptoms.  Son was treated recently with a course of amoxicillin.  Symptoms started over 2 weeks ago.  A lot of cough.  Tried taking over-the-counter allergy meds with modest improvement.  Symptoms have not improved over the last couple of weeks.       Objective/Observations  Physical Exam: Gen: NAD, resting comfortably Pulm: Normal work of breathing Neuro: Grossly normal, moves all extremities Psych: Normal affect and thought content  Virtual Visit via Video   I connected with Craig Jordan. on 08/29/22 at 11:20 AM EDT by a video enabled telemedicine application and verified that I am speaking with the correct person using two identifiers. The limitations of evaluation and management by telemedicine and the availability of in person appointments were discussed. The patient expressed understanding and agreed to proceed.   Patient location: Home Provider location: [place] participating in the virtual visit: Myself and Patient     Craig Jordan. Jerline Pain, MD 08/29/2022 11:16 AM

## 2022-09-01 ENCOUNTER — Telehealth: Payer: BC Managed Care – PPO | Admitting: Family Medicine

## 2022-09-24 ENCOUNTER — Telehealth: Payer: Self-pay

## 2022-09-24 NOTE — Telephone Encounter (Signed)
Nurse Assessment Nurse: Craig Ribas, RN, Craig Date/Time [name] Time): 09/24/2022 12:39:25 PM Confirm and document reason for call. If symptomatic, describe symptoms. ---Caller states has an infected hang nail on right hand for less than a week. Does the patient have any new or worsening symptoms? ---Yes Will a triage be completed? ---Yes Related visit to physician within the last 2 weeks? ---No Does the PT have any chronic conditions? (i.e. diabetes, asthma, this includes High risk factors for pregnancy, etc.) ---No Is this a behavioral health or substance abuse call? ---No Guidelines Guideline Title Affirmed Question Affirmed Notes Nurse Date/Time (Eastern Time) Finger Pain Looks infected (spreading redness, pus) (Exception: Localized redness and swelling of skin around nail.) Craig Ribas, RN, Craig 09/24/2022 12:41:01 PM Disp. Time [name] Time) Disposition Final User 09/24/2022 12:42:52 PM See PCP within 24 Hours Yes Craig Ribas, RN, [place] Final Disposition 09/24/2022 12:42:52 PM See PCP within 24 Hours Yes Craig Ribas, RN, [name] PLEASE NOTE: All timestamps contained within this report are represented as [place] Standard Time. CONFIDENTIALTY NOTICE: This fax transmission is intended only for the addressee. It contains information that is legally privileged, confidential or otherwise protected from use or disclosure. If you are not the intended recipient, you are strictly prohibited from reviewing, disclosing, copying using or disseminating any of this information or taking any action in reliance on or regarding this information. If you have received this fax in error, please notify us immediately by telephone so that we can arrange for its return to [place]. Phone: [phone], Toll-Free: [phone], Fax: [phone] Page: 2 of 2 Call Id: [id number] Caller Disagree/Comply Comply Caller Understands Yes PreDisposition Call Doctor Care Advice Given Per Guideline SEE PCP WITHIN 24 HOURS: * IF  OFFICE WILL BE OPEN: You need to be examined within the next 24 hours. Call your doctor (or NP/PA) when the office opens and make an appointment. CLEANSING: * Wash the area with soap and water. ANTIBIOTIC OINTMENT: * Apply antibiotic ointment (OTC) to the area 3 times per day. * Keep covered with a Bandaid if there is any drainage. PAIN MEDICINES: CALL BACK IF: * Fever occurs * You become worse CARE ADVICE given per Finger Pain (Adult) guideline. Referrals REFERRED TO PCP OFFICE

## 2022-09-25 ENCOUNTER — Ambulatory Visit (INDEPENDENT_AMBULATORY_CARE_PROVIDER_SITE_OTHER): Payer: BC Managed Care – PPO | Admitting: Family Medicine

## 2022-09-25 ENCOUNTER — Encounter: Payer: Self-pay | Admitting: Family Medicine

## 2022-09-25 VITALS — BP 118/82 | HR 88 | Temp 97.6°F | Resp 18 | Wt 338.0 lb

## 2022-09-25 DIAGNOSIS — L03011 Cellulitis of right finger: Secondary | ICD-10-CM

## 2022-09-25 MED ORDER — CEPHALEXIN 500 MG PO CAPS: 500.0000 mg | ORAL_CAPSULE | Freq: Three times a day (TID) | ORAL | 0 refills | Status: AC

## 2022-09-25 NOTE — Progress Notes (Signed)
New Patient Office Visit  Subjective    Patient ID: Craig Jordan., male    DOB: [birth year]  Age: 51 y.o. MRN: [id number]  CC:  Chief Complaint  Patient presents with   Acute Visit    Infected hangnail on pointer finger, right hand ongoing x1 week swelling finger has purple appearance     HPI Lilli Few.  For evaluation of a sore and swollen finger over the last 3 to 4 days.  He does bite his nails at times.  He also works in Aeronautical engineer.  Outpatient Encounter Medications as of 09/25/2022  Medication Sig   amphetamine-dextroamphetamine (ADDERALL) 20 MG tablet Take 1 tablet (20 mg total) by mouth 3 (three) times daily.   amphetamine-dextroamphetamine (ADDERALL) 30 MG tablet Take 1 tablet by mouth 2 (two) times daily.   azelastine (ASTELIN) 0.1 % nasal spray Place 2 sprays into both nostrils 2 (two) times daily.   cephALEXin (KEFLEX) 500 MG capsule Take 1 capsule (500 mg total) by mouth 3 (three) times daily for 10 days.   latanoprost (XALATAN) 0.005 % ophthalmic solution    oxymetazoline (AFRIN) 0.05 % nasal spray Place 2 sprays into the nose at bedtime as needed. For nasal congestion   pravastatin (PRAVACHOL) 40 MG tablet TAKE 1 TABLET(40 MG) BY MOUTH DAILY   amphetamine-dextroamphetamine (ADDERALL) 20 MG tablet Take 1 tablet (20 mg total) by mouth 3 (three) times daily. To fill in 60 days (Patient not taking: Reported on 09/25/2022)   amphetamine-dextroamphetamine (ADDERALL) 20 MG tablet Take 1 tablet (20 mg total) by mouth 3 (three) times daily. To fill in 30 days (Patient not taking: Reported on 09/25/2022)   amphetamine-dextroamphetamine (ADDERALL) 20 MG tablet Take 1 tablet (20 mg total) by mouth 3 (three) times daily. (Patient not taking: Reported on 09/25/2022)   benzonatate (TESSALON) 200 MG capsule Take 1 capsule (200 mg total) by mouth 2 (two) times daily as needed for cough. (Patient not taking: Reported on 09/25/2022)   diphenhydrAMINE (BENADRYL) 25 MG tablet Take  25 mg by mouth at bedtime as needed. (Patient not taking: Reported on 09/25/2022)   ibuprofen (ADVIL) 800 MG tablet Take 1 tablet (800 mg total) by mouth 3 (three) times daily. (Patient not taking: Reported on 09/25/2022)   loratadine (CLARITIN) 10 MG tablet Take 10 mg by mouth daily. (Patient not taking: Reported on 09/25/2022)   [DISCONTINUED] amoxicillin-clavulanate (AUGMENTIN) 875-125 MG tablet Take 1 tablet by mouth 2 (two) times daily. (Patient not taking: Reported on 09/25/2022)   No facility-administered encounter medications on file as of 09/25/2022.    Past Medical History:  Diagnosis Date   ADHD (attention deficit hyperactivity disorder)    Allergy    Diabetes mellitus without complication (HCC)    Glaucoma    Hyperlipidemia    Obesity     Past Surgical History:  Procedure Laterality Date   KNEE SURGERY     arthroscopic -  B meniscus repairs    Family History  Problem Relation Age of Onset   Seizures Father    Epilepsy Father    Arthritis Father        hip OA s/p hip replacement   Sleep apnea Brother     Social History   Socioeconomic History   Marital status: Single    Spouse name: Not on file   Number of children: 0   Years of education: Not on file   Highest education level: Not on file  Occupational History   Occupation:  LANDSCAPING  Tobacco Use   Smoking status: Former    Types: Cigarettes    Quit date: 05/17/1994    Years since quitting: 28.3   Smokeless tobacco: Never  Substance and Sexual Activity   Alcohol use: No    Alcohol/week: 0.0 standard drinks of alcohol    Comment: "Stopped 2 weeks ago" stated on 07/28/2014   Drug use: Yes    Frequency: 7.0 times per week    Types: Marijuana   Sexual activity: Yes    Birth control/protection: Abstinence  Other Topics Concern   Not on file  Social History Narrative   Marital status: married in 2017; together x 3 years.      Children: none      Lives: with wife      Employment: Aeronautical engineer; owns  business.      Tobacco:  None       Alcohol: randomly; twice monthly      Drugs: marijuana      Exercise:  Physically demanding          Social Determinants of Health   Financial Resource Strain: Not on file  Food Insecurity: Not on file  Transportation Needs: Not on file  Physical Activity: Not on file  Stress: Not on file  Social Connections: Not on file  Intimate Partner Violence: Not on file    Review of Systems  Constitutional: Negative.   HENT: Negative.    Eyes:  Negative for blurred vision, discharge and redness.  Respiratory: Negative.    Cardiovascular: Negative.   Gastrointestinal:  Negative for abdominal pain.  Genitourinary: Negative.   Musculoskeletal: Negative.  Negative for myalgias.  Skin:  Negative for rash.  Neurological:  Negative for tingling, loss of consciousness and weakness.  Endo/Heme/Allergies:  Negative for polydipsia.        Objective    BP 118/82 (BP Location: Right Arm, Patient Position: Sitting, Cuff Size: Normal)   Pulse 88   Temp 97.6 F (36.4 C) (Temporal)   Resp 18   Wt (!) 338 lb (153.3 kg)   SpO2 98%   BMI 47.14 kg/m   Physical Exam Constitutional:      General: He is not in acute distress.    Appearance: Normal appearance. He is not ill-appearing, toxic-appearing or diaphoretic.  HENT:     Head: Normocephalic and atraumatic.     Right Ear: External ear normal.     Left Ear: External ear normal.  Eyes:     General: No scleral icterus.       Right eye: No discharge.        Left eye: No discharge.     Extraocular Movements: Extraocular movements intact.     Conjunctiva/sclera: Conjunctivae normal.  Pulmonary:     Effort: Pulmonary effort is normal. No respiratory distress.  Skin:    General: Skin is warm and dry.       Neurological:     Mental Status: He is alert and oriented to person, place, and time.  Psychiatric:        Mood and Affect: Mood normal.        Behavior: Behavior normal.   Incision and  Drainage Procedure Note  Pre-operative Diagnosis: Paronychia  Post-operative Diagnosis: normal  Indications: Pain and swelling  Anesthesia: ethyl chloride spray  Procedure Details  The procedure, risks and complications have been discussed in detail (including, but not limited to airway compromise, infection, bleeding) with the patient, and the patient has signed consent to the procedure.  The skin was sterilely prepped and draped over the affected area in the usual fashion. After adequate local anesthesia, I&D with a #11 blade was performed on the right lateral second finger nail. Purulent drainage: present The patient was observed until stable.  Findings: Paronychia  EBL: 0 cc's  Drains: none  Condition: Tolerated procedure well   Complications: none.        Assessment & Plan:   Problem List Items Addressed This Visit       Musculoskeletal and Integument   Paronychia of finger of right hand - Primary   Relevant Medications   cephALEXin (KEFLEX) 500 MG capsule   Other Relevant Orders   Wound culture    Return if symptoms worsen or fail to improve.  Keflex 3 times daily for 10 days.  Information was given on paronychia and cellulitis.  Wound culture sent.  Return if not improving.  Will use ibuprofen or Tylenol as needed.  Mliss Sax, MD

## 2022-09-25 NOTE — Telephone Encounter (Signed)
Appt scheduled w/ Dr. Doreene Burke

## 2022-09-28 LAB — WOUND CULTURE
MICRO NUMBER:: 14167591
SPECIMEN QUALITY:: ADEQUATE

## 2022-11-02 ENCOUNTER — Other Ambulatory Visit: Payer: Self-pay | Admitting: Family Medicine

## 2022-11-08 NOTE — Patient Instructions (Signed)
It was great to see again today, I will be in touch with your labs soon as possible Recommend annual flu shot, latest COVID-19 booster if not done already- flu shot given today!   You also now qualify for the shingles vaccine series at your convenience  Saxenda for weight loss- start with 0.6 mg daily for one week Then 1.2 mg daily for one week Then 1.8 mg for one week Then 2.4 mg for one week Then to max daily dose of 3 mg However, if getting good results at a lower dose ok to stick with a lower dose!   Try the viagra as needed for ED-start with a 1/2 tablet.  Can use a whole tablet if needed!  Assuming all is well please see me in about 4 months to check on weight loss progress

## 2022-11-08 NOTE — Progress Notes (Unsigned)
[facility] at [facility] [address] [place], [place] [address] [phone]  Date:  11/12/2022   Name:  Craig Jordan.   DOB:  11/13/1971   MRN:  [id number]  PCP:  Pearline Cables, MD    Chief Complaint: No chief complaint on file.   History of Present Illness:  Craig Ethington. is a 51 y.o. very pleasant male patient who presents with the following:  Patient seen today for physical exam Most recent visit with myself was in June History of ADD, glaucoma, obesity, hyperlipidemia, prediabetes   He and his wife adopted a son last year, he is now about 18 months  Using Adderall for ADHD symptoms We have tried to get a GLP-1 approved for weight loss without success. I got a coronary calcium score for him over the summer-65th percentile, continue statin treatment  Lab Results  Component Value Date   HGBA1C 5.8 04/28/2022   Can offer sugars Flu shot, latest COVID booster Cologuard up-to-date BMP, A1c completed in June-otherwise he is due for labs today Patient Active Problem List   Diagnosis Date Noted   Paronychia of finger of right hand 09/25/2022   Prediabetes 04/28/2022   Pure hypercholesterolemia 08/29/2016   Glaucoma 02/05/2015   Snoring 02/11/2013   Chest pain, atypical 12/20/2012   ADD (attention deficit disorder) 12/13/2012   Obesity 12/13/2012    Past Medical History:  Diagnosis Date   ADHD (attention deficit hyperactivity disorder)    Allergy    Diabetes mellitus without complication (HCC)    Glaucoma    Hyperlipidemia    Obesity     Past Surgical History:  Procedure Laterality Date   KNEE SURGERY     arthroscopic -  B meniscus repairs    Social History   Tobacco Use   Smoking status: Former    Types: Cigarettes    Quit date: 05/17/1994    Years since quitting: 28.4   Smokeless tobacco: Never  Substance Use Topics   Alcohol use: No    Alcohol/week: 0.0 standard drinks of alcohol     Comment: "Stopped 2 weeks ago" stated on 07/28/2014   Drug use: Yes    Frequency: 7.0 times per week    Types: Marijuana    Family History  Problem Relation Age of Onset   Seizures Father    Epilepsy Father    Arthritis Father        hip OA s/p hip replacement   Sleep apnea Brother     No Known Allergies  Medication list has been reviewed and updated.  Current Outpatient Medications on File Prior to Visit  Medication Sig Dispense Refill   amphetamine-dextroamphetamine (ADDERALL) 20 MG tablet Take 1 tablet (20 mg total) by mouth 3 (three) times daily. 90 tablet 0   amphetamine-dextroamphetamine (ADDERALL) 20 MG tablet Take 1 tablet (20 mg total) by mouth 3 (three) times daily. To fill in 60 days (Patient not taking: Reported on 09/25/2022) 90 tablet 0   amphetamine-dextroamphetamine (ADDERALL) 20 MG tablet Take 1 tablet (20 mg total) by mouth 3 (three) times daily. To fill in 30 days (Patient not taking: Reported on 09/25/2022) 90 tablet 0   amphetamine-dextroamphetamine (ADDERALL) 20 MG tablet Take 1 tablet (20 mg total) by mouth 3 (three) times daily. (Patient not taking: Reported on 09/25/2022) 90 tablet 0   amphetamine-dextroamphetamine (ADDERALL) 30 MG tablet Take 1 tablet by mouth 2 (two) times daily. 60 tablet  0   azelastine (ASTELIN) 0.1 % nasal spray Place 2 sprays into both nostrils 2 (two) times daily. 30 mL 12   benzonatate (TESSALON) 200 MG capsule Take 1 capsule (200 mg total) by mouth 2 (two) times daily as needed for cough. (Patient not taking: Reported on 09/25/2022) 20 capsule 0   diphenhydrAMINE (BENADRYL) 25 MG tablet Take 25 mg by mouth at bedtime as needed. (Patient not taking: Reported on 09/25/2022)     ibuprofen (ADVIL) 800 MG tablet Take 1 tablet (800 mg total) by mouth 3 (three) times daily. (Patient not taking: Reported on 09/25/2022) 21 tablet 0   latanoprost (XALATAN) 0.005 % ophthalmic solution      loratadine (CLARITIN) 10 MG tablet Take 10 mg by mouth  daily. (Patient not taking: Reported on 09/25/2022)     oxymetazoline (AFRIN) 0.05 % nasal spray Place 2 sprays into the nose at bedtime as needed. For nasal congestion     pravastatin (PRAVACHOL) 40 MG tablet TAKE 1 TABLET(40 MG) BY MOUTH DAILY 90 tablet 3   No current facility-administered medications on file prior to visit.    Review of Systems:  As per HPI- otherwise negative.   Physical Examination: There were no vitals filed for this visit. There were no vitals filed for this visit. There is no height or weight on file to calculate BMI. Ideal Body Weight:    GEN: no acute distress. HEENT: Atraumatic, Normocephalic.  Ears and Nose: No external deformity. CV: RRR, No M/G/R. No JVD. No thrill. No extra heart sounds. PULM: CTA B, no wheezes, crackles, rhonchi. No retractions. No resp. distress. No accessory muscle use. ABD: S, NT, ND, +BS. No rebound. No HSM. EXTR: No c/c/e PSYCH: Normally interactive. Conversant.    Assessment and Plan: *** Physical exam today.  Encouraged healthy diet and exercise routine Will plan further follow- up pending labs.  Signed Abbe Amsterdam, MD

## 2022-11-12 ENCOUNTER — Encounter: Payer: Self-pay | Admitting: Family Medicine

## 2022-11-12 ENCOUNTER — Ambulatory Visit (INDEPENDENT_AMBULATORY_CARE_PROVIDER_SITE_OTHER): Payer: BC Managed Care – PPO | Admitting: Family Medicine

## 2022-11-12 VITALS — BP 118/80 | HR 77 | Temp 97.5°F | Resp 16 | Ht 71.0 in | Wt 343.4 lb

## 2022-11-12 DIAGNOSIS — Z0000 Encounter for general adult medical examination without abnormal findings: Secondary | ICD-10-CM | POA: Diagnosis not present

## 2022-11-12 DIAGNOSIS — E785 Hyperlipidemia, unspecified: Secondary | ICD-10-CM

## 2022-11-12 DIAGNOSIS — R7303 Prediabetes: Secondary | ICD-10-CM | POA: Diagnosis not present

## 2022-11-12 DIAGNOSIS — Z130 Encounter for screening for diseases of the blood and blood-forming organs and certain disorders involving the immune mechanism: Secondary | ICD-10-CM

## 2022-11-12 DIAGNOSIS — E7800 Pure hypercholesterolemia, unspecified: Secondary | ICD-10-CM

## 2022-11-12 DIAGNOSIS — F988 Other specified behavioral and emotional disorders with onset usually occurring in childhood and adolescence: Secondary | ICD-10-CM

## 2022-11-12 DIAGNOSIS — N529 Male erectile dysfunction, unspecified: Secondary | ICD-10-CM

## 2022-11-12 DIAGNOSIS — Z23 Encounter for immunization: Secondary | ICD-10-CM

## 2022-11-12 DIAGNOSIS — Z125 Encounter for screening for malignant neoplasm of prostate: Secondary | ICD-10-CM

## 2022-11-12 DIAGNOSIS — E66813 Obesity, class 3: Secondary | ICD-10-CM

## 2022-11-12 DIAGNOSIS — Z6841 Body Mass Index (BMI) 40.0 and over, adult: Secondary | ICD-10-CM

## 2022-11-12 LAB — CBC
HCT: 43.5 % (ref 39.0–52.0)
Hemoglobin: 14.6 g/dL (ref 13.0–17.0)
MCHC: 33.6 g/dL (ref 30.0–36.0)
MCV: 85.2 fl (ref 78.0–100.0)
Platelets: 258 10*3/uL (ref 150.0–400.0)
RBC: 5.11 Mil/uL (ref 4.22–5.81)
RDW: 13.6 % (ref 11.5–15.5)
WBC: 6.5 10*3/uL (ref 4.0–10.5)

## 2022-11-12 LAB — LIPID PANEL
Cholesterol: 187 mg/dL (ref 0–200)
HDL: 55.7 mg/dL (ref >39.00)
LDL Cholesterol: 114 mg/dL — ABNORMAL HIGH (ref 0–99)
NonHDL: 131.54
Total CHOL/HDL Ratio: 3
Triglycerides: 86 mg/dL (ref 0.0–149.0)
VLDL: 17.2 mg/dL (ref 0.0–40.0)

## 2022-11-12 LAB — COMPREHENSIVE METABOLIC PANEL
ALT: 26 U/L (ref 0–53)
AST: 17 U/L (ref 0–37)
Albumin: 4.3 g/dL (ref 3.5–5.2)
Alkaline Phosphatase: 77 U/L (ref 39–117)
BUN: 16 mg/dL (ref 6–23)
CO2: 27 mEq/L (ref 19–32)
Calcium: 9.4 mg/dL (ref 8.4–10.5)
Chloride: 104 mEq/L (ref 96–112)
Creatinine, Ser: 0.97 mg/dL (ref 0.40–1.50)
GFR: 91.31 mL/min (ref >60.00)
Glucose, Bld: 95 mg/dL (ref 70–99)
Potassium: 4.3 mEq/L (ref 3.5–5.1)
Sodium: 138 mEq/L (ref 135–145)
Total Bilirubin: 0.4 mg/dL (ref 0.2–1.2)
Total Protein: 6.9 g/dL (ref 6.0–8.3)

## 2022-11-12 LAB — HEMOGLOBIN A1C: Hgb A1c MFr Bld: 5.9 % (ref 4.6–6.5)

## 2022-11-12 LAB — PSA: PSA: 0.63 ng/mL (ref 0.10–4.00)

## 2022-11-12 MED ORDER — AMPHETAMINE-DEXTROAMPHETAMINE 20 MG PO TABS: 20.0000 mg | ORAL_TABLET | Freq: Three times a day (TID) | ORAL | 0 refills | Status: DC

## 2022-11-12 MED ORDER — PEN NEEDLES 32G X 5 MM MISC: 3 refills | Status: DC

## 2022-11-12 MED ORDER — SILDENAFIL CITRATE 100 MG PO TABS: 50.0000 mg | ORAL_TABLET | Freq: Every day | ORAL | 11 refills | Status: DC | PRN

## 2022-11-12 MED ORDER — SAXENDA 18 MG/3ML ~~LOC~~ SOPN: 3.0000 mg | PEN_INJECTOR | Freq: Every day | SUBCUTANEOUS | 6 refills | Status: DC

## 2022-11-26 ENCOUNTER — Telehealth: Payer: Self-pay | Admitting: Family Medicine

## 2022-11-26 NOTE — Telephone Encounter (Signed)
Patient states that the weight loss medication he has been on has been on backorder for a long time. He states his pharmacy has ozempic and mounjaro in stock so he would like to change to either one. Please advise.

## 2022-11-27 NOTE — Telephone Encounter (Signed)
Would you advise pt to check and see which is preferred by ins?

## 2022-11-28 ENCOUNTER — Encounter: Payer: Self-pay | Admitting: Family Medicine

## 2023-01-28 ENCOUNTER — Ambulatory Visit (INDEPENDENT_AMBULATORY_CARE_PROVIDER_SITE_OTHER): Payer: BC Managed Care – PPO | Admitting: Family Medicine

## 2023-01-28 VITALS — BP 122/76 | HR 89 | Temp 97.7°F | Resp 18 | Ht 71.0 in

## 2023-01-28 DIAGNOSIS — J0110 Acute frontal sinusitis, unspecified: Secondary | ICD-10-CM | POA: Diagnosis not present

## 2023-01-28 DIAGNOSIS — Z6841 Body Mass Index (BMI) 40.0 and over, adult: Secondary | ICD-10-CM | POA: Diagnosis not present

## 2023-01-28 MED ORDER — AMOXICILLIN 500 MG PO CAPS: 1000.0000 mg | ORAL_CAPSULE | Freq: Two times a day (BID) | ORAL | 0 refills | Status: DC

## 2023-01-28 MED ORDER — PREDNISONE 20 MG PO TABS: ORAL_TABLET | ORAL | 0 refills | Status: DC

## 2023-01-28 NOTE — Progress Notes (Signed)
[facility] at [facility] [address] [place], [place] [address] [phone]  Date:  01/28/2023   Name:  Craig Jordan.   DOB:  August 14, 1971   MRN:  [id number]  PCP:  Darreld Mclean, MD    Chief Complaint: URI (Possible Sinus Infection, Congestion, Sneezing, Post Nasal Drip, Headache // Covid Neg 3.12.24. sxs started on 01/25/23. )   History of Present Illness:  Craig Jordan. is a 52 y.o. very pleasant male patient who presents with the following:  Pt seen today with concern of sinusitis Last seen by myself in December for his CPE  History of ADD, glaucoma, obesity, hyperlipidemia, prediabetes   He has noted severe sinus pressure for about 4 days now Tried plenty of OTC meds but not really helpful so far   No fever or chills He has some PND leading to cough but otherwise not really coughing   He did not take any meds as of yet today  He did test for covid yesterday and he was negative   We talked about weight loss today.  We had tried to get a GLP-1 drug for patient but have been unsuccessful, he does not have diabetes.  I have also discussed weight loss surgery with him before, this may be a more appropriate permanent solution for him   Lab Results  Component Value Date   HGBA1C 5.9 11/12/2022   Son will be 2 in July     Patient Active Problem List   Diagnosis Date Noted   Paronychia of finger of right hand 09/25/2022   Prediabetes 04/28/2022   Pure hypercholesterolemia 08/29/2016   Glaucoma 02/05/2015   Snoring 02/11/2013   Chest pain, atypical 12/20/2012   ADD (attention deficit disorder) 12/13/2012   Obesity 12/13/2012    Past Medical History:  Diagnosis Date   ADHD (attention deficit hyperactivity disorder)    Allergy    Diabetes mellitus without complication ([place])    Glaucoma    Hyperlipidemia    Obesity     Past Surgical History:  Procedure Laterality Date   KNEE SURGERY      arthroscopic -  B meniscus repairs    Social History   Tobacco Use   Smoking status: Former    Types: Cigarettes    Quit date: 05/17/1994    Years since quitting: 28.7   Smokeless tobacco: Never  Substance Use Topics   Alcohol use: No    Alcohol/week: 0.0 standard drinks of alcohol    Comment: "Stopped 2 weeks ago" stated on 07/28/2014   Drug use: Yes    Frequency: 7.0 times per week    Types: Marijuana    Family History  Problem Relation Age of Onset   Seizures Father    Epilepsy Father    Arthritis Father        hip OA s/p hip replacement   Sleep apnea Brother     No Known Allergies  Medication list has been reviewed and updated.  Current Outpatient Medications on File Prior to Visit  Medication Sig Dispense Refill   amphetamine-dextroamphetamine (ADDERALL) 20 MG tablet Take 1 tablet (20 mg total) by mouth 3 (three) times daily. 90 tablet 0   amphetamine-dextroamphetamine (ADDERALL) 20 MG tablet Take 1 tablet (20 mg total) by mouth 3 (three) times daily. 90 tablet 0   amphetamine-dextroamphetamine (ADDERALL) 20 MG tablet Take 1 tablet (20 mg total) by mouth 3 (three) times daily. To  fill in 60 days 90 tablet 0   amphetamine-dextroamphetamine (ADDERALL) 20 MG tablet Take 1 tablet (20 mg total) by mouth 3 (three) times daily. To fill in 30 days 90 tablet 0   amphetamine-dextroamphetamine (ADDERALL) 30 MG tablet Take 1 tablet by mouth 2 (two) times daily. 60 tablet 0   azelastine (ASTELIN) 0.1 % nasal spray Place 2 sprays into both nostrils 2 (two) times daily. 30 mL 12   diphenhydrAMINE (BENADRYL) 25 MG tablet Take 25 mg by mouth at bedtime as needed.     ibuprofen (ADVIL) 800 MG tablet Take 1 tablet (800 mg total) by mouth 3 (three) times daily. 21 tablet 0   Insulin Pen Needle (PEN NEEDLES) 32G X 5 MM MISC Use one daily as needed for injection 100 each 3   latanoprost (XALATAN) 0.005 % ophthalmic solution      Liraglutide -Weight Management (SAXENDA) 18 MG/3ML SOPN Inject  3 mg into the skin daily. Start with 0.6 mg and titrate to '3mg'$   as directed 15 mL 6   loratadine (CLARITIN) 10 MG tablet Take 10 mg by mouth daily.     oxymetazoline (AFRIN) 0.05 % nasal spray Place 2 sprays into the nose at bedtime as needed. For nasal congestion     pravastatin (PRAVACHOL) 40 MG tablet TAKE 1 TABLET(40 MG) BY MOUTH DAILY 90 tablet 3   sildenafil (VIAGRA) 100 MG tablet Take 0.5-1 tablets (50-100 mg total) by mouth daily as needed for erectile dysfunction. 5 tablet 11   No current facility-administered medications on file prior to visit.    Review of Systems:  As per HPI- otherwise negative.   Physical Examination: Vitals:   01/28/23 1017  BP: 122/76  Pulse: 89  Resp: 18  Temp: 97.7 F (36.5 C)  SpO2: 98%   Vitals:   01/28/23 1017  Height: '5\' 11"'$  (1.803 m)   Body mass index is 47.89 kg/m. Ideal Body Weight: Weight in (lb) to have BMI = 25: 178.9  GEN: no acute distress.  Obese, looks well HEENT: Atraumatic, Normocephalic.  Bilateral TM wnl, oropharynx normal.  PEERL,EOMI. nasal cavity congestion Ears and Nose: No external deformity. CV: RRR, No M/G/R. No JVD. No thrill. No extra heart sounds. PULM: CTA B, no wheezes, crackles, rhonchi. No retractions. No resp. distress. No accessory muscle use. EXTR: No c/c/e PSYCH: Normally interactive. Conversant.    Assessment and Plan: Acute non-recurrent frontal sinusitis - Plan: amoxicillin (AMOXIL) 500 MG capsule, predniSONE (DELTASONE) 20 MG tablet  Class 3 severe obesity due to excess calories with serious comorbidity and body mass index (BMI) of 40.0 to 44.9 in adult [facility])  Patient seen today with concern of sinusitis.  Will treat with amoxicillin, prednisone.  I have asked him to let me know if not feeling better in the next few days Discussed obesity.  He may try a weight loss clinic in [place] which was able to get his friend a GLP-1 prescription.  Failing this I did encourage him to think about weight loss  surgery.  I am glad to help him with this process if he is interested  Signed Lamar Blinks, MD

## 2023-01-28 NOTE — Patient Instructions (Signed)
Good to see you again today- I hope you are feeling better soon!   Use the amoxicillin and prednisone for your symptoms  Please let me know if you are not feeling better in a few days- Sooner if worse.   I do think weight loss surgery would be of benefit for you, especially if we cannot get a GLP-1 drug for you

## 2023-02-09 ENCOUNTER — Telehealth: Payer: Self-pay | Admitting: Family Medicine

## 2023-02-09 DIAGNOSIS — F988 Other specified behavioral and emotional disorders with onset usually occurring in childhood and adolescence: Secondary | ICD-10-CM

## 2023-02-09 MED ORDER — AMPHETAMINE-DEXTROAMPHETAMINE 20 MG PO TABS: 20.0000 mg | ORAL_TABLET | Freq: Three times a day (TID) | ORAL | 0 refills | Status: DC

## 2023-02-09 NOTE — Telephone Encounter (Signed)
Prescription Request  02/09/2023  Is this a "Controlled Substance" medicine? Yes  LOV: 01/28/2023  What is the name of the medication or equipment?   amphetamine-dextroamphetamine (ADDERALL) 20 MG tablet [id number]   Have you contacted your pharmacy to request a refill? No   Which pharmacy would you like this sent to?   [facility] [place], [place] - [address] [place] [address] [place]  [address] Phone: [phone] Fax: [phone]  Patient notified that their request is being sent to the clinical staff for review and that they should receive a response within 2 business days.   Please advise at Mobile [phone] (mobile)

## 2023-02-09 NOTE — Addendum Note (Signed)
Addended by: Lamar Blinks C on: 02/09/2023 04:01 PM   Modules accepted: Orders

## 2023-02-09 NOTE — Telephone Encounter (Signed)
Requesting: adderall 20 mg Contract: none UDS: none Last Visit: 11/12/2022 Next Visit: none Last Refill: 11/12/2022  Please Advise

## 2023-02-25 ENCOUNTER — Telehealth: Payer: Self-pay | Admitting: Family Medicine

## 2023-02-25 NOTE — Telephone Encounter (Signed)
Patient said that his insurance has made an exception and will allow ozempic. Pt requesting prescription to be called into [facility] in [place].

## 2023-02-26 ENCOUNTER — Encounter: Payer: Self-pay | Admitting: Family Medicine

## 2023-02-26 MED ORDER — OZEMPIC (0.25 OR 0.5 MG/DOSE) 2 MG/3ML ~~LOC~~ SOPN: PEN_INJECTOR | SUBCUTANEOUS | 2 refills | Status: DC

## 2023-02-26 NOTE — Addendum Note (Signed)
Addended by: Abbe Amsterdam C on: 02/26/2023 03:03 PM   Modules accepted: Orders

## 2023-02-26 NOTE — Telephone Encounter (Signed)
3 months ago you sent this message to the pt:  "Hi Muntasir, I got your message about Bernie Covey being on backorder. Unfortunately I do not think your insurance will cover either Ozempic or Mounjaro.  Although they are in the same medication class as Saxenda, they are technically FDA approved for diabetes-your insurance will almost certainly not pay for them for weight loss.     These medications are expensive, so typically insurance plans will not cover them if they do not   At this point I might recommend trying to get Saxenda again in 1 to 2 months-hopefully will be available again at that time."  Are you okay with [place] sending this Rx to the pharmacy?

## 2023-03-09 ENCOUNTER — Telehealth: Payer: Self-pay

## 2023-03-09 NOTE — Telephone Encounter (Signed)
PA: Carney Living (Key: [id number])

## 2023-03-11 ENCOUNTER — Encounter: Payer: Self-pay | Admitting: Family Medicine

## 2023-03-11 NOTE — Telephone Encounter (Signed)
PA Denied;   Reference #: [id number] Reason for Denial: The request doe not meet the definition of Medical Neccessity found in the member's benefit booklet.

## 2023-03-12 ENCOUNTER — Other Ambulatory Visit: Payer: Self-pay | Admitting: Family Medicine

## 2023-03-12 DIAGNOSIS — L03011 Cellulitis of right finger: Secondary | ICD-10-CM

## 2023-04-15 ENCOUNTER — Encounter: Payer: Self-pay | Admitting: Family Medicine

## 2023-04-16 ENCOUNTER — Other Ambulatory Visit (HOSPITAL_COMMUNITY): Payer: Self-pay

## 2023-04-27 ENCOUNTER — Other Ambulatory Visit (HOSPITAL_COMMUNITY): Payer: Self-pay

## 2023-04-28 ENCOUNTER — Encounter: Payer: Self-pay | Admitting: Family Medicine

## 2023-04-28 ENCOUNTER — Telehealth: Payer: Self-pay

## 2023-04-28 NOTE — Telephone Encounter (Signed)
Received a fax from [facility] stating that we need to start a PA for Ozempic. Has pt been paying for this OOP?

## 2023-04-29 ENCOUNTER — Other Ambulatory Visit (HOSPITAL_COMMUNITY): Payer: Self-pay

## 2023-04-29 DIAGNOSIS — H401131 Primary open-angle glaucoma, bilateral, mild stage: Secondary | ICD-10-CM | POA: Diagnosis not present

## 2023-05-26 ENCOUNTER — Telehealth: Payer: Self-pay | Admitting: Family Medicine

## 2023-05-26 ENCOUNTER — Other Ambulatory Visit: Payer: Self-pay | Admitting: Family Medicine

## 2023-05-26 ENCOUNTER — Other Ambulatory Visit: Payer: Self-pay | Admitting: Family

## 2023-05-26 DIAGNOSIS — F988 Other specified behavioral and emotional disorders with onset usually occurring in childhood and adolescence: Secondary | ICD-10-CM

## 2023-05-26 DIAGNOSIS — E7800 Pure hypercholesterolemia, unspecified: Secondary | ICD-10-CM

## 2023-05-26 MED ORDER — AMPHETAMINE-DEXTROAMPHETAMINE 20 MG PO TABS: 20.0000 mg | ORAL_TABLET | Freq: Three times a day (TID) | ORAL | 0 refills | Status: DC

## 2023-05-26 NOTE — Telephone Encounter (Signed)
Prescription Request  05/26/2023  Is this a "Controlled Substance" medicine? No  LOV: 01/28/2023  What is the name of the medication or equipment?  amphetamine-dextroamphetamine (ADDERALL) 20 MG tablet [[id number]]   Have you contacted your pharmacy to request a refill? No   Which pharmacy would you like this sent to?   [id number] - [place], [place] - [address] [place] [address] Phone: [phone] Fax: [phone]    Patient notified that their request is being sent to the clinical staff for review and that they should receive a response within 2 business days.   Please advise at Mobile [phone] (mobile)

## 2023-06-25 ENCOUNTER — Other Ambulatory Visit: Payer: Self-pay | Admitting: Family Medicine

## 2023-06-25 DIAGNOSIS — E7800 Pure hypercholesterolemia, unspecified: Secondary | ICD-10-CM

## 2023-06-26 ENCOUNTER — Encounter: Payer: Self-pay | Admitting: Family Medicine

## 2023-06-26 ENCOUNTER — Telehealth: Payer: Self-pay | Admitting: Family Medicine

## 2023-06-26 DIAGNOSIS — F988 Other specified behavioral and emotional disorders with onset usually occurring in childhood and adolescence: Secondary | ICD-10-CM

## 2023-06-26 MED ORDER — AMPHETAMINE-DEXTROAMPHETAMINE 20 MG PO TABS: 20.0000 mg | ORAL_TABLET | Freq: Three times a day (TID) | ORAL | 0 refills | Status: DC

## 2023-06-26 NOTE — Telephone Encounter (Signed)
Okay for refill?  

## 2023-06-26 NOTE — Telephone Encounter (Signed)
Prescription Request  06/26/2023  Is this a "Controlled Substance" medicine? Yes  LOV: 01/28/2023  What is the name of the medication or equipment?   amphetamine-dextroamphetamine (ADDERALL) 20 MG tablet [[id number]]  Have you contacted your pharmacy to request a refill? No   Which pharmacy would you like this sent to?   [facility] #[id number] - [place], [place] - [address] [place] [address] Phone: [phone] Fax: [phone]  Patient notified that their request is being sent to the clinical staff for review and that they should receive a response within 2 business days.   Please advise at Mobile [phone] (mobile)

## 2023-06-26 NOTE — Addendum Note (Signed)
Addended by: Abbe Amsterdam C on: 06/26/2023 08:11 PM   Modules accepted: Orders

## 2023-07-27 ENCOUNTER — Telehealth: Payer: Self-pay | Admitting: Family Medicine

## 2023-07-27 ENCOUNTER — Encounter: Payer: Self-pay | Admitting: Family Medicine

## 2023-07-27 DIAGNOSIS — F988 Other specified behavioral and emotional disorders with onset usually occurring in childhood and adolescence: Secondary | ICD-10-CM

## 2023-07-27 MED ORDER — AMPHETAMINE-DEXTROAMPHETAMINE 20 MG PO TABS: 20.0000 mg | ORAL_TABLET | Freq: Three times a day (TID) | ORAL | 0 refills | Status: DC

## 2023-07-27 NOTE — Telephone Encounter (Signed)
Okay for refill?  

## 2023-07-27 NOTE — Telephone Encounter (Signed)
Medication: amphetamine-dextroamphetamine (ADDERALL) 20 MG tablet  Has the patient contacted their pharmacy? Yes.     Preferred Pharmacy:  [facility] #[id number] - [place], [place] - [address] [facility] [address] [place], [place] [address] Phone: [phone]  Fax: [phone]

## 2023-08-26 ENCOUNTER — Encounter: Payer: Self-pay | Admitting: Family Medicine

## 2023-08-26 ENCOUNTER — Telehealth: Payer: Self-pay | Admitting: Family Medicine

## 2023-08-26 DIAGNOSIS — F988 Other specified behavioral and emotional disorders with onset usually occurring in childhood and adolescence: Secondary | ICD-10-CM

## 2023-08-26 MED ORDER — AMPHETAMINE-DEXTROAMPHETAMINE 20 MG PO TABS: 20.0000 mg | ORAL_TABLET | Freq: Three times a day (TID) | ORAL | 0 refills | Status: DC

## 2023-08-26 NOTE — Telephone Encounter (Signed)
Prescription Request  08/26/2023  Is this a "Controlled Substance" medicine? No  LOV: Visit date not found  What is the name of the medication or equipment?  amphetamine-dextroamphetamine (ADDERALL) 20 MG Have you contacted your pharmacy to request a refill? No   Which pharmacy would you like this sent to?  [facility] #[id number] - [place], [place] - [address] [place] [address] Phone: [phone] Fax: [phone]     Patient notified that their request is being sent to the clinical staff for review and that they should receive a response within 2 business days.   Please advise at [facility] [phone]

## 2023-08-26 NOTE — Telephone Encounter (Signed)
Okay for refill?  

## 2023-08-26 NOTE — Addendum Note (Signed)
Addended by: Abbe Amsterdam C on: 08/26/2023 10:12 AM   Modules accepted: Orders

## 2023-09-22 ENCOUNTER — Telehealth: Payer: Self-pay | Admitting: Family Medicine

## 2023-09-22 NOTE — Telephone Encounter (Signed)
Pt called stating that he currently has an ingrown hair and he stated the last time that this had happened, Dr. Patsy Lager had called him in an antibiotic. Pt is wondering if that could be done again.

## 2023-09-23 DIAGNOSIS — R7303 Prediabetes: Secondary | ICD-10-CM | POA: Diagnosis not present

## 2023-09-23 DIAGNOSIS — E66813 Obesity, class 3: Secondary | ICD-10-CM | POA: Diagnosis not present

## 2023-09-23 DIAGNOSIS — L089 Local infection of the skin and subcutaneous tissue, unspecified: Secondary | ICD-10-CM | POA: Diagnosis not present

## 2023-09-23 DIAGNOSIS — J3489 Other specified disorders of nose and nasal sinuses: Secondary | ICD-10-CM | POA: Diagnosis not present

## 2023-09-23 NOTE — Telephone Encounter (Signed)
Called pt and left a VM asking pt to send PCP a Mychart message with concerns. Advised pt to call the office back with any further questions or concerns.

## 2023-09-24 MED ORDER — CEPHALEXIN 500 MG PO CAPS: 500.0000 mg | ORAL_CAPSULE | Freq: Three times a day (TID) | ORAL | 0 refills | Status: DC

## 2023-09-24 NOTE — Telephone Encounter (Signed)
Spoke with pt 09/23/2023, pt stated he was on hold with the Mychart team trying to get into his Mychart account. Asked pt was it a login problem maybe needing to update a password however pt stated he was logged into his account just "wasn't able to use it". Advised pt to call the office back for any further questions or concerns.

## 2023-09-24 NOTE — Addendum Note (Signed)
Addended by: Abbe Amsterdam C on: 09/24/2023 12:45 PM   Modules accepted: Orders

## 2023-09-24 NOTE — Telephone Encounter (Signed)
Called pt back as he cannot acces his mychart No answer, left message on machine.  On message, let patient know I was hoping to get more details about this "infected hair bump" Assuming it is minor we can treat with Keflex, I [facility] prescription [facility] [place].  However, if this seems more serious or if it is not clearing up please do let me know

## 2023-09-30 ENCOUNTER — Telehealth: Payer: Self-pay | Admitting: Family Medicine

## 2023-09-30 DIAGNOSIS — F988 Other specified behavioral and emotional disorders with onset usually occurring in childhood and adolescence: Secondary | ICD-10-CM

## 2023-09-30 NOTE — Telephone Encounter (Signed)
Prescription Request  09/30/2023  Is this a "Controlled Substance" medicine? Yes  LOV: Visit date not found  What is the name of the medication or equipment?  amphetamine-dextroamphetamine (ADDERALL) 20 MG tablet  Have you contacted your pharmacy to request a refill? Yes   Which pharmacy would you like this sent to?  [facility] #[id number] - [place], [place] - [address] [place] [address] Phone: [phone] Fax: [phone]      Patient notified that their request is being sent to the clinical staff for review and that they should receive a response within 2 business days.   Please advise at [facility] [phone]

## 2023-10-01 MED ORDER — AMPHETAMINE-DEXTROAMPHETAMINE 20 MG PO TABS: 20.0000 mg | ORAL_TABLET | Freq: Three times a day (TID) | ORAL | 0 refills | Status: DC

## 2023-10-01 NOTE — Addendum Note (Signed)
Addended by: Abbe Amsterdam C on: 10/01/2023 08:50 AM   Modules accepted: Orders

## 2023-10-02 NOTE — Telephone Encounter (Signed)
I have scheduled the pt for 10/05/23 at 11am

## 2023-10-03 NOTE — Patient Instructions (Incomplete)
It was good to see you again today, I will be in touch with your lab work

## 2023-10-03 NOTE — Progress Notes (Unsigned)
[facility] [address] [place], [place] [address] [phone]  Date:  10/05/2023   Name:  Craig Jordan.   DOB:  [birth year]   MRN:  [id number]  PCP:  Pearline Cables, MD    Chief Complaint: No chief complaint on file.   History of Present Illness:  Craig Jordan. is a 52 y.o. very pleasant male patient who presents with the following:  Patient seen today for follow-up and medication monitoring-  History of ADD, glaucoma, obesity, hyperlipidemia, prediabetes   He is treated for ADHD with Adderall 20, 3 tablets daily Most recent visit with myself was in March of this year-we talked about weight loss at that time.  He has not been able to get a GLP-1 paid for.  We did discuss weight loss surgery  Flu vaccine COVID booster Can start Shingrix at his convenience Cologuard up-to-date Update labs today  Adderall Pravastatin Sildenafil as needed Patient Active Problem List   Diagnosis Date Noted   Paronychia of finger of right hand 09/25/2022   Prediabetes 04/28/2022   Pure hypercholesterolemia 08/29/2016   Glaucoma 02/05/2015   Snoring 02/11/2013   Chest pain, atypical 12/20/2012   ADD (attention deficit disorder) 12/13/2012   Obesity 12/13/2012    Past Medical History:  Diagnosis Date   ADHD (attention deficit hyperactivity disorder)    Allergy    Diabetes mellitus without complication (HCC)    Glaucoma    Hyperlipidemia    Obesity     Past Surgical History:  Procedure Laterality Date   KNEE SURGERY     arthroscopic -  B meniscus repairs    Social History   Tobacco Use   Smoking status: Former    Current packs/day: 0.00    Types: Cigarettes    Quit date: 05/17/1994    Years since quitting: 29.4   Smokeless tobacco: Never  Substance Use Topics   Alcohol use: No    Alcohol/week: 0.0 standard drinks of alcohol    Comment: "Stopped 2 weeks ago" stated on 07/28/2014   Drug use: Yes     Frequency: 7.0 times per week    Types: Marijuana    Family History  Problem Relation Age of Onset   Seizures Father    Epilepsy Father    Arthritis Father        hip OA s/p hip replacement   Sleep apnea Brother     No Known Allergies  Medication list has been reviewed and updated.  Current Outpatient Medications on File Prior to Visit  Medication Sig Dispense Refill   amoxicillin (AMOXIL) 500 MG capsule Take 2 capsules (1,000 mg total) by mouth 2 (two) times daily. 40 capsule 0   amphetamine-dextroamphetamine (ADDERALL) 20 MG tablet Take 1 tablet (20 mg total) by mouth 3 (three) times daily. To fill in 60 days 90 tablet 0   amphetamine-dextroamphetamine (ADDERALL) 20 MG tablet Take 1 tablet (20 mg total) by mouth 3 (three) times daily. To fill in 30 days 90 tablet 0   amphetamine-dextroamphetamine (ADDERALL) 20 MG tablet Take 1 tablet (20 mg total) by mouth 3 (three) times daily. Need office visit for further refills 90 tablet 0   amphetamine-dextroamphetamine (ADDERALL) 20 MG tablet Take 1 tablet (20 mg total) by mouth 3 (three) times daily. No further refills without office visit please 45 tablet 0   amphetamine-dextroamphetamine (ADDERALL) 30 MG tablet Take 1 tablet by mouth 2 (two)  times daily. 60 tablet 0   azelastine (ASTELIN) 0.1 % nasal spray Place 2 sprays into both nostrils 2 (two) times daily. 30 mL 12   cephALEXin (KEFLEX) 500 MG capsule Take 1 capsule (500 mg total) by mouth 3 (three) times daily. 21 capsule 0   diphenhydrAMINE (BENADRYL) 25 MG tablet Take 25 mg by mouth at bedtime as needed.     ibuprofen (ADVIL) 800 MG tablet Take 1 tablet (800 mg total) by mouth 3 (three) times daily. 21 tablet 0   Insulin Pen Needle (PEN NEEDLES) 32G X 5 MM MISC Use one daily as needed for injection 100 each 3   latanoprost (XALATAN) 0.005 % ophthalmic solution      loratadine (CLARITIN) 10 MG tablet Take 10 mg by mouth daily.     oxymetazoline (AFRIN) 0.05 % nasal spray Place 2  sprays into the nose at bedtime as needed. For nasal congestion     pravastatin (PRAVACHOL) 40 MG tablet TAKE 1 TABLET(40 MG) BY MOUTH DAILY 90 tablet 0   predniSONE (DELTASONE) 20 MG tablet Take 40 mg by mouth daily for 3 days, then 20 mg by mouth daily for 3 days 9 tablet 0   Semaglutide,0.25 or 0.5MG /DOS, (OZEMPIC, 0.25 OR 0.5 MG/DOSE,) 2 MG/3ML SOPN Inject 0.25 mg subcutaneous weekly for 4 weeks, then increase to 0.5 mg weekly 3 mL 2   sildenafil (VIAGRA) 100 MG tablet Take 0.5-1 tablets (50-100 mg total) by mouth daily as needed for erectile dysfunction. 5 tablet 11   No current facility-administered medications on file prior to visit.    Review of Systems:  As per HPI- otherwise negative.   Physical Examination: There were no vitals filed for this visit. There were no vitals filed for this visit. There is no height or weight on file to calculate BMI. Ideal Body Weight:    GEN: no acute distress. HEENT: Atraumatic, Normocephalic.  Ears and Nose: No external deformity. CV: RRR, No M/G/R. No JVD. No thrill. No extra heart sounds. PULM: CTA B, no wheezes, crackles, rhonchi. No retractions. No resp. distress. No accessory muscle use. ABD: S, NT, ND, +BS. No rebound. No HSM. EXTR: No c/c/e PSYCH: Normally interactive. Conversant.    Assessment and Plan: ***  Signed Abbe Amsterdam, MD

## 2023-10-05 ENCOUNTER — Other Ambulatory Visit: Payer: Self-pay | Admitting: Family Medicine

## 2023-10-05 ENCOUNTER — Ambulatory Visit (INDEPENDENT_AMBULATORY_CARE_PROVIDER_SITE_OTHER): Payer: Self-pay | Admitting: Family Medicine

## 2023-10-05 VITALS — BP 124/88 | HR 69 | Temp 97.8°F | Resp 18 | Ht 71.0 in | Wt 329.4 lb

## 2023-10-05 DIAGNOSIS — F988 Other specified behavioral and emotional disorders with onset usually occurring in childhood and adolescence: Secondary | ICD-10-CM | POA: Diagnosis not present

## 2023-10-05 DIAGNOSIS — E7800 Pure hypercholesterolemia, unspecified: Secondary | ICD-10-CM

## 2023-10-05 DIAGNOSIS — Z23 Encounter for immunization: Secondary | ICD-10-CM

## 2023-10-05 DIAGNOSIS — E66813 Obesity, class 3: Secondary | ICD-10-CM

## 2023-10-05 DIAGNOSIS — Z125 Encounter for screening for malignant neoplasm of prostate: Secondary | ICD-10-CM

## 2023-10-05 DIAGNOSIS — R7303 Prediabetes: Secondary | ICD-10-CM | POA: Diagnosis not present

## 2023-10-05 DIAGNOSIS — Z0000 Encounter for general adult medical examination without abnormal findings: Secondary | ICD-10-CM | POA: Diagnosis not present

## 2023-10-05 DIAGNOSIS — X32XXXA Exposure to sunlight, initial encounter: Secondary | ICD-10-CM

## 2023-10-05 DIAGNOSIS — E785 Hyperlipidemia, unspecified: Secondary | ICD-10-CM

## 2023-10-05 DIAGNOSIS — J0110 Acute frontal sinusitis, unspecified: Secondary | ICD-10-CM

## 2023-10-05 DIAGNOSIS — Z6841 Body Mass Index (BMI) 40.0 and over, adult: Secondary | ICD-10-CM

## 2023-10-05 DIAGNOSIS — Z130 Encounter for screening for diseases of the blood and blood-forming organs and certain disorders involving the immune mechanism: Secondary | ICD-10-CM

## 2023-10-05 MED ORDER — PRAVASTATIN SODIUM 40 MG PO TABS: 40.0000 mg | ORAL_TABLET | Freq: Every day | ORAL | 3 refills | Status: DC

## 2023-10-05 MED ORDER — AMPHETAMINE-DEXTROAMPHETAMINE 20 MG PO TABS: 20.0000 mg | ORAL_TABLET | Freq: Three times a day (TID) | ORAL | 0 refills | Status: DC

## 2023-10-23 DIAGNOSIS — J019 Acute sinusitis, unspecified: Secondary | ICD-10-CM | POA: Diagnosis not present

## 2023-10-23 DIAGNOSIS — J209 Acute bronchitis, unspecified: Secondary | ICD-10-CM | POA: Diagnosis not present

## 2023-10-23 DIAGNOSIS — B9689 Other specified bacterial agents as the cause of diseases classified elsewhere: Secondary | ICD-10-CM | POA: Diagnosis not present

## 2023-11-21 NOTE — Progress Notes (Deleted)
 [facility] [address] [place], [place] [address] [phone]  Date:  11/23/2023   Name:  Craig Jordan.   DOB:  Apr 15, 1971   MRN:  [id number]  PCP:  Watt Harlene BROCKS, MD    Chief Complaint: No chief complaint on file.   History of Present Illness:  Craig Jordan. is a 53 y.o. very pleasant male patient who presents with the following:  ***  Patient Active Problem List   Diagnosis Date Noted   Paronychia of finger of right hand 09/25/2022   Prediabetes 04/28/2022   Pure hypercholesterolemia 08/29/2016   Glaucoma 02/05/2015   Snoring 02/11/2013   Chest pain, atypical 12/20/2012   ADD (attention deficit disorder) 12/13/2012   Obesity 12/13/2012    Past Medical History:  Diagnosis Date   ADHD (attention deficit hyperactivity disorder)    Allergy    Diabetes mellitus without complication (HCC)    Glaucoma    Hyperlipidemia    Obesity     Past Surgical History:  Procedure Laterality Date   KNEE SURGERY     arthroscopic -  B meniscus repairs    Social History   Tobacco Use   Smoking status: Former    Current packs/day: 0.00    Types: Cigarettes    Quit date: 05/17/1994    Years since quitting: 29.5   Smokeless tobacco: Never  Substance Use Topics   Alcohol use: No    Alcohol/week: 0.0 standard drinks of alcohol    Comment: Stopped 2 weeks ago stated on 07/28/2014   Drug use: Yes    Frequency: 7.0 times per week    Types: Marijuana    Family History  Problem Relation Age of Onset   Seizures Father    Epilepsy Father    Arthritis Father        hip OA s/p hip replacement   Sleep apnea Brother     No Known Allergies  Medication list has been reviewed and updated.  Current Outpatient Medications on File Prior to Visit  Medication Sig Dispense Refill   amphetamine -dextroamphetamine  (ADDERALL) 20 MG tablet Take 1 tablet (20 mg total) by mouth 3 (three) times daily. To fill in  60 days 90 tablet 0   amphetamine -dextroamphetamine  (ADDERALL) 20 MG tablet Take 1 tablet (20 mg total) by mouth 3 (three) times daily. Need office visit for further refills 90 tablet 0   amphetamine -dextroamphetamine  (ADDERALL) 20 MG tablet Take 1 tablet (20 mg total) by mouth 3 (three) times daily. To fill in 30 days 90 tablet 0   amphetamine -dextroamphetamine  (ADDERALL) 20 MG tablet Take 1 tablet (20 mg total) by mouth 3 (three) times daily. 90 tablet 0   amphetamine -dextroamphetamine  (ADDERALL) 30 MG tablet Take 1 tablet by mouth 2 (two) times daily. 60 tablet 0   azelastine  (ASTELIN ) 0.1 % nasal spray Place 2 sprays into both nostrils 2 (two) times daily. 30 mL 12   doxycycline  (VIBRA -TABS) 100 MG tablet Take 100 mg by mouth 2 (two) times daily.     ibuprofen  (ADVIL ) 800 MG tablet Take 1 tablet (800 mg total) by mouth 3 (three) times daily. 21 tablet 0   latanoprost (XALATAN) 0.005 % ophthalmic solution      mupirocin ointment (BACTROBAN) 2 % Apply to affected area 3 times daily     oxymetazoline (AFRIN) 0.05 % nasal spray Place 2 sprays into the nose at bedtime as needed. For nasal congestion  pravastatin  (PRAVACHOL ) 40 MG tablet Take 1 tablet (40 mg total) by mouth daily. 90 tablet 3   sildenafil  (VIAGRA ) 100 MG tablet Take 0.5-1 tablets (50-100 mg total) by mouth daily as needed for erectile dysfunction. 5 tablet 11   No current facility-administered medications on file prior to visit.    Review of Systems:  ***  Physical Examination: There were no vitals filed for this visit. There were no vitals filed for this visit. There is no height or weight on file to calculate BMI. Ideal Body Weight:    ***  Assessment and Plan: ***  Signed Harlene Schroeder, MD

## 2023-11-23 ENCOUNTER — Encounter: Payer: BC Managed Care – PPO | Admitting: Family Medicine

## 2023-12-15 ENCOUNTER — Other Ambulatory Visit: Payer: Self-pay | Admitting: Family Medicine

## 2023-12-15 DIAGNOSIS — N529 Male erectile dysfunction, unspecified: Secondary | ICD-10-CM

## 2023-12-31 ENCOUNTER — Other Ambulatory Visit: Payer: Self-pay | Admitting: Family Medicine

## 2023-12-31 DIAGNOSIS — F988 Other specified behavioral and emotional disorders with onset usually occurring in childhood and adolescence: Secondary | ICD-10-CM

## 2023-12-31 MED ORDER — AMPHETAMINE-DEXTROAMPHETAMINE 20 MG PO TABS: 20.0000 mg | ORAL_TABLET | Freq: Three times a day (TID) | ORAL | 0 refills | Status: DC

## 2023-12-31 NOTE — Telephone Encounter (Signed)
Copied from CRM [id number]. Topic: Clinical - Medication Refill >> Dec 31, 2023  9:57 AM Pascal Lux wrote: Most Recent Primary Care Visit:  Provider: Pearline Cables  Department: [facility]  Visit Type: OFFICE VISIT  Date: 10/05/2023  Medication: amphetamine-dextroamphetamine (ADDERALL) 20 MG tablet   Has the patient contacted their pharmacy? Yes (Agent: If no, request that the patient contact the pharmacy for the refill. If patient does not wish to contact the pharmacy document the reason why and proceed with request.) (Agent: If yes, when and what did the pharmacy advise?) - Not eligible for refills  Is this the correct pharmacy for this prescription? Yes If no, delete pharmacy and type the correct one.  This is the patient's preferred pharmacy:  [id number] - [place], [place] - [address] [place] [address] Phone: [phone] Fax: [phone]   Has the prescription been filled recently? No  Is the patient out of the medication? Yes  Has the patient been seen for an appointment in the last year OR does the patient have an upcoming appointment? Yes  Can we respond through MyChart? Yes  Agent: Please be advised that Rx refills may take up to 3 business days. We ask that you follow-up with your pharmacy.

## 2024-01-27 ENCOUNTER — Telehealth: Payer: Self-pay

## 2024-01-27 DIAGNOSIS — M25561 Pain in right knee: Secondary | ICD-10-CM

## 2024-01-27 NOTE — Addendum Note (Signed)
 Addended by: Abbe Amsterdam C on: 01/27/2024 01:49 PM   Modules accepted: Orders

## 2024-01-27 NOTE — Telephone Encounter (Signed)
 Copied from CRM [id number]. Topic: Referral - Question >> Jan 27, 2024 10:42 AM Alcus Dad wrote: Reason for CRM: Patient is seeking referral for Orthopedic regarding Right knee. Patient had knee surgery and is having complications.

## 2024-01-29 ENCOUNTER — Other Ambulatory Visit: Payer: Self-pay | Admitting: Family Medicine

## 2024-01-29 DIAGNOSIS — F988 Other specified behavioral and emotional disorders with onset usually occurring in childhood and adolescence: Secondary | ICD-10-CM

## 2024-01-29 NOTE — Telephone Encounter (Signed)
 Copied from CRM [id number]. Topic: Clinical - Medication Refill >> Jan 29, 2024  1:38 PM Elizebeth Brooking wrote: Most Recent Primary Care Visit:  Provider: Pearline Cables  Department: [facility]  Visit Type: OFFICE VISIT  Date: 10/05/2023  Medication: amphetamine-dextroamphetamine (ADDERALL) 20 MG tablet   Has the patient contacted their pharmacy? Yes (Agent: If no, request that the patient contact the pharmacy for the refill. If patient does not wish to contact the pharmacy document the reason why and proceed with request.) (Agent: If yes, when and what did the pharmacy advise?)  Is this the correct pharmacy for this prescription? Yes If no, delete pharmacy and type the correct one.  This is the patient's preferred pharmacy:  [facility] [place] [id number] - [place], [place] - [address] [place] [address] Phone: [phone] Fax: [phone]   Has the prescription been filled recently? No  Is the patient out of the medication? Yes  Has the patient been seen for an appointment in the last year OR does the patient have an upcoming appointment? Yes  Can we respond through MyChart? Yes  Agent: Please be advised that Rx refills may take up to 3 business days. We ask that you follow-up with your pharmacy.

## 2024-01-29 NOTE — Telephone Encounter (Signed)
 Last Fill: 01/20/23 90 tabs/0 RF  Last OV: 10/05/23 Next OV: 02/03/24  Routing to provider for review/authorization.

## 2024-02-01 MED ORDER — AMPHETAMINE-DEXTROAMPHETAMINE 20 MG PO TABS: 20.0000 mg | ORAL_TABLET | Freq: Three times a day (TID) | ORAL | 0 refills | Status: DC

## 2024-02-01 NOTE — Telephone Encounter (Signed)
 Requesting: Adderall 20 mg  Contract: N/A UDS: N/A Last Visit: 10/05/2023 Next Visit: 02/03/2024 Last Refill: 10/05/2023  Please Advise

## 2024-02-01 NOTE — Progress Notes (Unsigned)
 [name] [address] [place], [place] [address] [phone]  Date:  02/03/2024   Name:  Craig Jordan.   DOB:  [birth year]   MRN:  [id number]  PCP:  Pearline Cables, MD    Chief Complaint: No chief complaint on file.   History of Present Illness:  Craig Seefeldt. is a 53 y.o. very pleasant male patient who presents with the following:  Pt seen today to discuss cologuard and vaccinations  Most recent visit with myself was in November  Negative cologuard 3 years ago  History of ADD, glaucoma, obesity, hyperlipidemia, prediabetes   He is treated for ADHD with Adderall 20, 3 tablets daily- this is working well for him, we will continue his current dose  Last adderall RD 2/13  Covid booster Flu UTD Shingrix   Patient Active Problem List   Diagnosis Date Noted   Paronychia of finger of right hand 09/25/2022   Prediabetes 04/28/2022   Pure hypercholesterolemia 08/29/2016   Glaucoma 02/05/2015   Snoring 02/11/2013   Chest pain, atypical 12/20/2012   ADD (attention deficit disorder) 12/13/2012   Obesity 12/13/2012    Past Medical History:  Diagnosis Date   ADHD (attention deficit hyperactivity disorder)    Allergy    Diabetes mellitus without complication (HCC)    Glaucoma    Hyperlipidemia    Obesity     Past Surgical History:  Procedure Laterality Date   KNEE SURGERY     arthroscopic -  B meniscus repairs    Social History   Tobacco Use   Smoking status: Former    Current packs/day: 0.00    Types: Cigarettes    Quit date: 05/17/1994    Years since quitting: 29.7   Smokeless tobacco: Never  Substance Use Topics   Alcohol use: No    Alcohol/week: 0.0 standard drinks of alcohol    Comment: "Stopped 2 weeks ago" stated on 07/28/2014   Drug use: Yes    Frequency: 7.0 times per week    Types: Marijuana    Family History  Problem Relation Age of Onset   Seizures Father     Epilepsy Father    Arthritis Father        hip OA s/p hip replacement   Sleep apnea Brother     No Known Allergies  Medication list has been reviewed and updated.  Current Outpatient Medications on File Prior to Visit  Medication Sig Dispense Refill   amphetamine-dextroamphetamine (ADDERALL) 20 MG tablet Take 1 tablet (20 mg total) by mouth 3 (three) times daily. Need office visit for further refills 90 tablet 0   amphetamine-dextroamphetamine (ADDERALL) 20 MG tablet Take 1 tablet (20 mg total) by mouth 3 (three) times daily. 90 tablet 0   amphetamine-dextroamphetamine (ADDERALL) 20 MG tablet Take 1 tablet (20 mg total) by mouth 3 (three) times daily. To fill in 60 days 90 tablet 0   amphetamine-dextroamphetamine (ADDERALL) 20 MG tablet Take 1 tablet (20 mg total) by mouth 3 (three) times daily. To fill in 30 days 90 tablet 0   amphetamine-dextroamphetamine (ADDERALL) 30 MG tablet Take 1 tablet by mouth 2 (two) times daily. 60 tablet 0   azelastine (ASTELIN) 0.1 % nasal spray Place 2 sprays into both nostrils 2 (two) times daily. 30 mL 12   doxycycline (VIBRA-TABS) 100 MG tablet Take 100 mg by mouth 2 (two) times daily.     ibuprofen (ADVIL)  800 MG tablet Take 1 tablet (800 mg total) by mouth 3 (three) times daily. 21 tablet 0   latanoprost (XALATAN) 0.005 % ophthalmic solution      mupirocin ointment (BACTROBAN) 2 % Apply to affected area 3 times daily     oxymetazoline (AFRIN) 0.05 % nasal spray Place 2 sprays into the nose at bedtime as needed. For nasal congestion     pravastatin (PRAVACHOL) 40 MG tablet Take 1 tablet (40 mg total) by mouth daily. 90 tablet 3   sildenafil (VIAGRA) 100 MG tablet TAKE 1/2 TO 1 TABLET(50 TO 100 MG) BY MOUTH DAILY AS NEEDED FOR ERECTILE DYSFUNCTION 5 tablet 11   No current facility-administered medications on file prior to visit.    Review of Systems:  As per HPI- otherwise negative.   Physical Examination: There were no vitals filed for this  visit. There were no vitals filed for this visit. There is no height or weight on file to calculate BMI. Ideal Body Weight:    GEN: no acute distress. HEENT: Atraumatic, Normocephalic.  Ears and Nose: No external deformity. CV: RRR, No M/G/R. No JVD. No thrill. No extra heart sounds. PULM: CTA B, no wheezes, crackles, rhonchi. No retractions. No resp. distress. No accessory muscle use. ABD: S, NT, ND, +BS. No rebound. No HSM. EXTR: No c/c/e PSYCH: Normally interactive. Conversant.    Assessment and Plan: ***  Signed Abbe Amsterdam, MD

## 2024-02-03 ENCOUNTER — Ambulatory Visit (INDEPENDENT_AMBULATORY_CARE_PROVIDER_SITE_OTHER): Admitting: Family Medicine

## 2024-02-03 ENCOUNTER — Encounter: Payer: Self-pay | Admitting: Family Medicine

## 2024-02-03 VITALS — BP 122/82 | HR 78 | Temp 97.8°F | Resp 18 | Ht 71.0 in | Wt 330.6 lb

## 2024-02-03 DIAGNOSIS — R7303 Prediabetes: Secondary | ICD-10-CM

## 2024-02-03 DIAGNOSIS — E7800 Pure hypercholesterolemia, unspecified: Secondary | ICD-10-CM

## 2024-02-03 DIAGNOSIS — Z1283 Encounter for screening for malignant neoplasm of skin: Secondary | ICD-10-CM

## 2024-02-03 DIAGNOSIS — E66813 Obesity, class 3: Secondary | ICD-10-CM

## 2024-02-03 DIAGNOSIS — Z1211 Encounter for screening for malignant neoplasm of colon: Secondary | ICD-10-CM | POA: Diagnosis not present

## 2024-02-03 DIAGNOSIS — Z130 Encounter for screening for diseases of the blood and blood-forming organs and certain disorders involving the immune mechanism: Secondary | ICD-10-CM | POA: Diagnosis not present

## 2024-02-03 DIAGNOSIS — Z23 Encounter for immunization: Secondary | ICD-10-CM

## 2024-02-03 DIAGNOSIS — Z6841 Body Mass Index (BMI) 40.0 and over, adult: Secondary | ICD-10-CM

## 2024-02-03 DIAGNOSIS — N529 Male erectile dysfunction, unspecified: Secondary | ICD-10-CM | POA: Diagnosis not present

## 2024-02-03 DIAGNOSIS — Z125 Encounter for screening for malignant neoplasm of prostate: Secondary | ICD-10-CM | POA: Diagnosis not present

## 2024-02-03 LAB — COMPREHENSIVE METABOLIC PANEL
ALT: 23 U/L (ref 0–53)
AST: 15 U/L (ref 0–37)
Albumin: 4.3 g/dL (ref 3.5–5.2)
Alkaline Phosphatase: 84 U/L (ref 39–117)
BUN: 18 mg/dL (ref 6–23)
CO2: 31 meq/L (ref 19–32)
Calcium: 9.7 mg/dL (ref 8.4–10.5)
Chloride: 104 meq/L (ref 96–112)
Creatinine, Ser: 1.12 mg/dL (ref 0.40–1.50)
GFR: 76.18 mL/min (ref >60.00)
Glucose, Bld: 95 mg/dL (ref 70–99)
Potassium: 4.6 meq/L (ref 3.5–5.1)
Sodium: 141 meq/L (ref 135–145)
Total Bilirubin: 0.4 mg/dL (ref 0.2–1.2)
Total Protein: 6.7 g/dL (ref 6.0–8.3)

## 2024-02-03 LAB — CBC
HCT: 44 % (ref 39.0–52.0)
Hemoglobin: 14.6 g/dL (ref 13.0–17.0)
MCHC: 33.2 g/dL (ref 30.0–36.0)
MCV: 86.6 fl (ref 78.0–100.0)
Platelets: 218 10*3/uL (ref 150.0–400.0)
RBC: 5.08 Mil/uL (ref 4.22–5.81)
RDW: 13.9 % (ref 11.5–15.5)
WBC: 6.5 10*3/uL (ref 4.0–10.5)

## 2024-02-03 LAB — LIPID PANEL
Cholesterol: 163 mg/dL (ref 0–200)
HDL: 50.4 mg/dL (ref >39.00)
LDL Cholesterol: 87 mg/dL (ref 0–99)
NonHDL: 112.85
Total CHOL/HDL Ratio: 3
Triglycerides: 127 mg/dL (ref 0.0–149.0)
VLDL: 25.4 mg/dL (ref 0.0–40.0)

## 2024-02-03 LAB — PSA: PSA: 0.64 ng/mL (ref 0.10–4.00)

## 2024-02-03 LAB — HEMOGLOBIN A1C: Hgb A1c MFr Bld: 5.9 % (ref 4.6–6.5)

## 2024-02-03 MED ORDER — SILDENAFIL CITRATE 100 MG PO TABS: ORAL_TABLET | ORAL | 11 refills | Status: AC

## 2024-02-03 NOTE — Addendum Note (Signed)
 Addended by: Abbe Amsterdam C on: 02/03/2024 04:33 PM   Modules accepted: Orders

## 2024-02-03 NOTE — Patient Instructions (Addendum)
 Ordered Cologaurd for you  Orthopedics for your knee: [facility] [address] ([address]oor 2) [place], [place] [address] Phone: [phone] Fax: [phone]  I will work on your dermatology referral- I am sorry this did not get done for you  Recommend covid booster and shingles vaccine series (Shingrix) at your convenience  Routine labs today as well

## 2024-03-01 ENCOUNTER — Other Ambulatory Visit: Payer: Self-pay | Admitting: Family Medicine

## 2024-03-01 DIAGNOSIS — F988 Other specified behavioral and emotional disorders with onset usually occurring in childhood and adolescence: Secondary | ICD-10-CM

## 2024-03-01 MED ORDER — AMPHETAMINE-DEXTROAMPHETAMINE 20 MG PO TABS: 20.0000 mg | ORAL_TABLET | Freq: Three times a day (TID) | ORAL | 0 refills | Status: DC

## 2024-03-01 NOTE — Telephone Encounter (Signed)
 Copied from CRM [id number]. Topic: Clinical - Medication Refill >> Mar 01, 2024  2:23 PM Albertha Alosa wrote: Most Recent Primary Care Visit:  Provider: Kaylee Partridge  Department: [facility]  Visit Type: OFFICE VISIT  Date: 02/03/2024  Medication: amphetamine-dextroamphetamine (ADDERALL) 20 MG tablet  Has the patient contacted their pharmacy? Yes (Agent: If no, request that the patient contact the pharmacy for the refill. If patient does not wish to contact the pharmacy document the reason why and proceed with request.) (Agent: If yes, when and what did the pharmacy advise?)  Is this the correct pharmacy for this prescription? Yes If no, delete pharmacy and type the correct one.  This is the patient's preferred pharmacy:  [facility] #[id number] - [place], [place] - [address] [place] [address] Phone: [phone] Fax: [phone]  [facility] #[id number] [place], [place] - [address] AT [facility] [address] [place] [address] Phone: [phone] Fax: [phone]  [id number] - [place], [place] - [address] [place] [address] Phone: [phone] Fax: [phone]   Has the prescription been filled recently? No  Is the patient out of the medication? Yes  Has the patient been seen for an appointment in the last year OR does the patient have an upcoming appointment? Yes  Can we respond through MyChart? Yes  Agent: Please be advised that Rx refills may take up to 3 business days. We ask that you follow-up with your pharmacy.

## 2024-04-06 DIAGNOSIS — Z1211 Encounter for screening for malignant neoplasm of colon: Secondary | ICD-10-CM | POA: Diagnosis not present

## 2024-04-12 LAB — COLOGUARD: COLOGUARD: NEGATIVE

## 2024-04-13 ENCOUNTER — Encounter: Payer: Self-pay | Admitting: Family Medicine

## 2024-04-13 DIAGNOSIS — F988 Other specified behavioral and emotional disorders with onset usually occurring in childhood and adolescence: Secondary | ICD-10-CM

## 2024-04-25 ENCOUNTER — Ambulatory Visit: Admitting: Dermatology

## 2024-04-25 ENCOUNTER — Encounter: Payer: Self-pay | Admitting: Dermatology

## 2024-04-25 VITALS — BP 144/93 | HR 82

## 2024-04-25 DIAGNOSIS — L821 Other seborrheic keratosis: Secondary | ICD-10-CM | POA: Diagnosis not present

## 2024-04-25 DIAGNOSIS — L578 Other skin changes due to chronic exposure to nonionizing radiation: Secondary | ICD-10-CM

## 2024-04-25 DIAGNOSIS — L918 Other hypertrophic disorders of the skin: Secondary | ICD-10-CM

## 2024-04-25 DIAGNOSIS — Z1283 Encounter for screening for malignant neoplasm of skin: Secondary | ICD-10-CM

## 2024-04-25 DIAGNOSIS — D229 Melanocytic nevi, unspecified: Secondary | ICD-10-CM

## 2024-04-25 DIAGNOSIS — L814 Other melanin hyperpigmentation: Secondary | ICD-10-CM

## 2024-04-25 DIAGNOSIS — W908XXA Exposure to other nonionizing radiation, initial encounter: Secondary | ICD-10-CM

## 2024-04-25 DIAGNOSIS — D1801 Hemangioma of skin and subcutaneous tissue: Secondary | ICD-10-CM

## 2024-04-25 MED ORDER — AMPHETAMINE-DEXTROAMPHETAMINE 20 MG PO TABS
20.0000 mg | ORAL_TABLET | Freq: Three times a day (TID) | ORAL | 0 refills | Status: DC
Start: 2024-04-25 — End: 2024-05-23

## 2024-04-25 NOTE — Progress Notes (Signed)
   New Patient Visit   Subjective  Craig Jordan. is a 53 y.o. male who presents for the following: Skin Cancer Screening and Full Body Skin Exam.  No hx or family hx of skin cancer. 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   The patient presents for Total-Body Skin Exam (TBSE) for skin cancer screening and mole check. The patient has spots, moles and lesions to be evaluated, some may be new or changing.  The following portions of the chart were reviewed this encounter and updated as appropriate: medications, allergies, medical history  Review of Systems:  No other skin or systemic complaints except as noted in HPI or Assessment and Plan.  Objective  Well appearing patient in no apparent distress; mood and affect are within normal limits.  A full examination was performed including scalp, head, eyes, ears, nose, lips, neck, chest, axillae, abdomen, back, buttocks, bilateral upper extremities, bilateral lower extremities, hands, feet, fingers, toes, fingernails, and toenails. All findings within normal limits unless otherwise noted below.   Relevant physical exam findings are noted in the Assessment and Plan.    Assessment & Plan   SKIN CANCER SCREENING PERFORMED TODAY.  ACTINIC DAMAGE - Chronic condition, secondary to cumulative UV/sun exposure - diffuse scaly erythematous macules with underlying dyspigmentation - Recommend daily broad spectrum sunscreen SPF 30+ to sun-exposed areas, reapply every 2 hours as needed.  - Staying in the shade or wearing long sleeves, sun glasses (UVA+UVB protection) and wide brim hats (4-inch brim around the entire circumference of the hat) are also recommended for sun protection.  - Call for new or changing lesions.  LENTIGINES, SEBORRHEIC KERATOSES, HEMANGIOMAS - Benign normal skin lesions - Benign-appearing - Call for any changes  MELANOCYTIC NEVI - Tan-brown and/or pink-flesh-colored symmetric macules and papules - Benign  appearing on exam today - Observation - Call clinic for new or changing moles - Recommend daily use of broad spectrum spf 30+ sunscreen to sun-exposed areas.   Acrochordons (Skin Tags) - Fleshy, skin-colored pedunculated papules - Benign appearing.  - Observe. - If desired, they can be removed with an in office procedure that is not covered by insurance. - Please call the clinic if you notice any new or changing lesions.    Return in about 1 year (around 04/25/2025) for TBSC.  I, Haig Levan, Surg Tech III, am acting as scribe for Deneise Finlay, MD.   Documentation: I have reviewed the above documentation for accuracy and completeness, and I agree with the above.  Deneise Finlay, MD

## 2024-04-25 NOTE — Patient Instructions (Signed)

## 2024-05-23 ENCOUNTER — Other Ambulatory Visit: Payer: Self-pay | Admitting: Family Medicine

## 2024-05-23 DIAGNOSIS — F988 Other specified behavioral and emotional disorders with onset usually occurring in childhood and adolescence: Secondary | ICD-10-CM

## 2024-05-23 MED ORDER — AMPHETAMINE-DEXTROAMPHETAMINE 20 MG PO TABS: 20.0000 mg | ORAL_TABLET | Freq: Three times a day (TID) | ORAL | 0 refills | Status: DC

## 2024-05-23 NOTE — Telephone Encounter (Signed)
 Copied from CRM [id number]. Topic: Clinical - Medication Refill >> May 23, 2024 12:48 PM Aisha D wrote: Medication: amphetamine -dextroamphetamine  (ADDERALL) 20 MG tablet  Has the patient contacted their pharmacy? No (Agent: If no, request that the patient contact the pharmacy for the refill. If patient does not wish to contact the pharmacy document the reason why and proceed with request.) (Agent: If yes, when and what did the pharmacy advise?)  This is the patient's preferred pharmacy:  [facility] #[id number] - [place],  - [address] [place] [address] Phone: [phone] Fax: [phone]    Is this the correct pharmacy for this prescription? Yes If no, delete pharmacy and type the correct one.   Has the prescription been filled recently? No  Is the patient out of the medication? No  Has the patient been seen for an appointment in the last year OR does the patient have an upcoming appointment? Yes  Can we respond through MyChart? Yes  Agent: Please be advised that Rx refills may take up to 3 business days. We ask that you follow-up with your pharmacy.

## 2024-05-27 ENCOUNTER — Telehealth: Payer: Self-pay | Admitting: Family Medicine

## 2024-05-27 NOTE — Telephone Encounter (Signed)
 [facility] called and spoke to [place], [facility] about the refill(s) Aderrall requested. Advised it was sent on 05/23/24. He says it's too soon to fill it, it can be filled on 06/02/24. Patient called, left VM to return the call to the office or call the pharmacy. If he returns the call, let him know the above information.     Copied from CRM [id number]. Topic: Clinical - Medication Refill >> May 23, 2024 12:48 PM Aisha D wrote: Medication: amphetamine -dextroamphetamine  (ADDERALL) 20 MG tablet  Has the patient contacted their pharmacy? No (Agent: If no, request that the patient contact the pharmacy for the refill. If patient does not wish to contact the pharmacy document the reason why and proceed with request.) (Agent: If yes, when and what did the pharmacy advise?)  This is the patient's preferred pharmacy:  [id number] - [place], [place] - [address] [place] [address] Phone: [phone] Fax: [phone]    Is this the correct pharmacy for this prescription? Yes If no, delete pharmacy and type the correct one.   Has the prescription been filled recently? No  Is the patient out of the medication? No  Has the patient been seen for an appointment in the last year OR does the patient have an upcoming appointment? Yes  Can we respond through MyChart? Yes  Agent: Please be advised that Rx refills may take up to 3 business days. We ask that you follow-up with your pharmacy. >> May 27, 2024 12:08 PM Rosina D wrote: patient called stating he is checking on a medication request. Patient stated it is not at the pharmacy and he is out of it now CB [phone]

## 2024-07-04 ENCOUNTER — Other Ambulatory Visit: Payer: Self-pay | Admitting: Family Medicine

## 2024-07-04 DIAGNOSIS — F988 Other specified behavioral and emotional disorders with onset usually occurring in childhood and adolescence: Secondary | ICD-10-CM

## 2024-07-04 MED ORDER — AMPHETAMINE-DEXTROAMPHETAMINE 20 MG PO TABS: 20.0000 mg | ORAL_TABLET | Freq: Three times a day (TID) | ORAL | 0 refills | Status: DC

## 2024-07-04 NOTE — Telephone Encounter (Signed)
 Copied from CRM #[id number]. Topic: Clinical - Medication Refill >> Jul 04, 2024 10:42 AM Delon DASEN wrote: Medication: amphetamine -dextroamphetamine  (ADDERALL) 20 MG tablet  Has the patient contacted their pharmacy? No (Agent: If no, request that the patient contact the pharmacy for the refill. If patient does not wish to contact the pharmacy document the reason why and proceed with request.) (Agent: If yes, when and what did the pharmacy advise?)  This is the patient's preferred pharmacy:    [id number] - [place], [place] - [address] [place] [address] Phone: [phone] Fax: [phone]  Is this the correct pharmacy for this prescription? Yes If no, delete pharmacy and type the correct one.   Has the prescription been filled recently? Yes  Is the patient out of the medication? Yes  Has the patient been seen for an appointment in the last year OR does the patient have an upcoming appointment? Yes  Can we respond through MyChart? Yes  Agent: Please be advised that Rx refills may take up to 3 business days. We ask that you follow-up with your pharmacy.

## 2024-08-01 ENCOUNTER — Other Ambulatory Visit: Payer: Self-pay | Admitting: Family Medicine

## 2024-08-01 DIAGNOSIS — F988 Other specified behavioral and emotional disorders with onset usually occurring in childhood and adolescence: Secondary | ICD-10-CM

## 2024-08-01 MED ORDER — AMPHETAMINE-DEXTROAMPHETAMINE 20 MG PO TABS: 20.0000 mg | ORAL_TABLET | Freq: Three times a day (TID) | ORAL | 0 refills | Status: AC

## 2024-08-01 NOTE — Telephone Encounter (Unsigned)
 Copied from CRM #[id number]. Topic: Clinical - Medication Refill >> Aug 01, 2024  9:28 AM Berwyn MATSU wrote: Medication: amphetamine -dextroamphetamine  (ADDERALL) 20 MG tablet   Has the patient contacted their pharmacy? Yes (Agent: If no, request that the patient contact the pharmacy for the refill. If patient does not wish to contact the pharmacy document the reason why and proceed with request.) (Agent: If yes, when and what did the pharmacy advise?)  This is the patient's preferred pharmacy:  [id number] - [place], [place] - [address] [place] [address] Phone: [phone] Fax: [phone]    Is this the correct pharmacy for this prescription? Yes If no, delete pharmacy and type the correct one.   Has the prescription been filled recently? Yes  Is the patient out of the medication? Yes  Has the patient been seen for an appointment in the last year OR does the patient have an upcoming appointment? Yes  Can we respond through MyChart? Yes  Agent: Please be advised that Rx refills may take up to 3 business days. We ask that you follow-up with your pharmacy.

## 2024-08-14 ENCOUNTER — Other Ambulatory Visit: Payer: Self-pay

## 2024-08-14 ENCOUNTER — Emergency Department (HOSPITAL_COMMUNITY)

## 2024-08-14 ENCOUNTER — Encounter (HOSPITAL_COMMUNITY): Payer: Self-pay | Admitting: *Deleted

## 2024-08-14 ENCOUNTER — Emergency Department (HOSPITAL_COMMUNITY)
Admission: EM | Admit: 2024-08-14 | Discharge: 2024-08-15 | Discharge status: Against Medical Advice | Attending: Emergency Medicine | Admitting: Emergency Medicine

## 2024-08-14 DIAGNOSIS — R0789 Other chest pain: Secondary | ICD-10-CM | POA: Diagnosis not present

## 2024-08-14 DIAGNOSIS — M25539 Pain in unspecified wrist: Secondary | ICD-10-CM | POA: Diagnosis not present

## 2024-08-14 DIAGNOSIS — Z5321 Procedure and treatment not carried out due to patient leaving prior to being seen by health care provider: Secondary | ICD-10-CM | POA: Diagnosis not present

## 2024-08-14 DIAGNOSIS — M79602 Pain in left arm: Secondary | ICD-10-CM | POA: Insufficient documentation

## 2024-08-14 DIAGNOSIS — R0602 Shortness of breath: Secondary | ICD-10-CM | POA: Diagnosis not present

## 2024-08-14 LAB — CBC
HCT: 44.7 % (ref 39.0–52.0)
Hemoglobin: 14.5 g/dL (ref 13.0–17.0)
MCH: 28.5 pg (ref 26.0–34.0)
MCHC: 32.4 g/dL (ref 30.0–36.0)
MCV: 88 fL (ref 80.0–100.0)
Platelets: 253 K/uL (ref 150–400)
RBC: 5.08 MIL/uL (ref 4.22–5.81)
RDW: 12.6 % (ref 11.5–15.5)
WBC: 8 K/uL (ref 4.0–10.5)
nRBC: 0 % (ref 0.0–0.2)

## 2024-08-14 NOTE — ED Triage Notes (Signed)
 The pt is c/o of chest tightness since Friday with lt arm pain?? sob

## 2024-08-15 ENCOUNTER — Ambulatory Visit: Payer: Self-pay | Admitting: Family Medicine

## 2024-08-15 LAB — BASIC METABOLIC PANEL WITH GFR
Anion gap: 9 (ref 5–15)
BUN: 15 mg/dL (ref 6–20)
CO2: 23 mmol/L (ref 22–32)
Calcium: 8.9 mg/dL (ref 8.9–10.3)
Chloride: 105 mmol/L (ref 98–111)
Creatinine, Ser: 1.01 mg/dL (ref 0.61–1.24)
GFR, Estimated: >60 mL/min (ref >60)
Glucose, Bld: 113 mg/dL — ABNORMAL HIGH (ref 70–99)
Potassium: 4.1 mmol/L (ref 3.5–5.1)
Sodium: 137 mmol/L (ref 135–145)

## 2024-08-15 LAB — TROPONIN I (HIGH SENSITIVITY)
Troponin I (High Sensitivity): 5 ng/L (ref <18)
Troponin I (High Sensitivity): 6 ng/L (ref <18)

## 2024-08-15 NOTE — Telephone Encounter (Signed)
 FYI Only or Action Required?: Action required by provider: update on patient condition.  Patient was last seen in primary care on 02/03/2024 by Copland, Harlene BROCKS, MD.  Called Nurse Triage reporting Chest Pain.  Symptoms began several days ago.  Triage Disposition: See PCP When Office is Open (Within 3 Days)  Patient/caregiver understands and will follow disposition?: Yes              Copied from CRM #[id number]. Topic: Appointments - Appointment Scheduling >> Aug 15, 2024 11:44 AM Alfonso HERO wrote: Patient went to the ER last night for chest pains left before being give the results and is still having tightening of the chest. Reason for Disposition  [1] Chest pain lasting < 5 minutes AND [2] has not taken prescribed nitroglycerin  Answer Assessment - Initial Assessment Questions Pt had an EKG, blood work, and chest X-ray in ED last night. Pt left ED after 5 hrs as there was no bed available.  Pt is able to see his results in MyChart but does not understand them. This RN is only able to read the results but not allowed to interpret them as this must come from provider. Pt understands this. This RN scheduled pt for a hospital follow up visit with PCP on 10/8 in office. If PCP is able to, pt would appreciate a call at [phone] to discuss the findings from hospital last night.  This RN educated pt on new-worsening symptoms and when to call back/seek emergent care. Pt verbalized understanding and agrees to plan.    LOCATION: Where does it hurt?       Chest; got better as of today  RADIATION: Does the pain go anywhere else? (e.g., into neck, jaw, arms, back)     Back  ONSET: When did the chest pain begin? (Minutes, hours or days)      Fri  PATTERN: Does the pain come and go, or has it been constant since it started?  Does it get worse with exertion?      Comes and goes; pt got worried as it would pulse like a heartbeat, or throbbing of heart   SEVERITY: How  bad is the pain?  (e.g., Scale 1-10; mild, moderate, or severe)     Got up to 7-8/10 over weekend; now its lighter after catching up on sleep  CARDIAC RISK FACTORS: Do you have any history of heart problems or risk factors for heart disease? (e.g., angina, prior heart attack; diabetes, high blood pressure, high cholesterol, smoker, or strong family history of heart disease)     High cholesterol; mom's dad had open heart surgery  CAUSE: What do you think is causing the chest pain?     Thought it was a broken rib  OTHER SYMPTOMS: Do you have any other symptoms? (e.g., dizziness, nausea, vomiting, sweating, fever, difficulty breathing, cough)    BP was elevated in ED (140/91); pt does have a cough or hack   Denies dizziness, nausea, vomiting, sweating, fever, difficulty breathing  Protocols used: Chest Pain-A-AH

## 2024-08-15 NOTE — ED Notes (Signed)
 Pt assigned room but left waiting room before receiving treatment

## 2024-08-15 NOTE — ED Notes (Signed)
 Pt endorses worsening chest pain. Pt states that he needs a place to lay down. Triage RN aware.

## 2024-08-15 NOTE — Telephone Encounter (Signed)
 Called him back- LMOM.  I don't see anything emergent in his labs but would be glad to see him and talk more at his convenience

## 2024-08-16 DIAGNOSIS — R0789 Other chest pain: Secondary | ICD-10-CM | POA: Diagnosis not present

## 2024-08-16 DIAGNOSIS — I517 Cardiomegaly: Secondary | ICD-10-CM | POA: Diagnosis not present

## 2024-08-16 DIAGNOSIS — M549 Dorsalgia, unspecified: Secondary | ICD-10-CM | POA: Diagnosis not present

## 2024-08-17 ENCOUNTER — Inpatient Hospital Stay: Admitting: Family Medicine

## 2024-08-18 DIAGNOSIS — Z681 Body mass index (BMI) 19 or less, adult: Secondary | ICD-10-CM | POA: Diagnosis not present

## 2024-08-18 DIAGNOSIS — R030 Elevated blood-pressure reading, without diagnosis of hypertension: Secondary | ICD-10-CM | POA: Diagnosis not present

## 2024-08-18 DIAGNOSIS — I4510 Unspecified right bundle-branch block: Secondary | ICD-10-CM | POA: Diagnosis not present

## 2024-08-18 DIAGNOSIS — G8929 Other chronic pain: Secondary | ICD-10-CM | POA: Diagnosis not present

## 2024-08-18 DIAGNOSIS — R12 Heartburn: Secondary | ICD-10-CM | POA: Diagnosis not present

## 2024-08-18 DIAGNOSIS — M542 Cervicalgia: Secondary | ICD-10-CM | POA: Diagnosis not present

## 2024-08-23 ENCOUNTER — Telehealth: Payer: Self-pay

## 2024-08-23 NOTE — Telephone Encounter (Signed)
 Initial Comment Caller asks what time his appt is today or tomorrow. He misplaced his reminders. Additional Comment Caller requests callback to confirm appt details. Caller is standing by for callback. Office hours provided. Advised to callback. Translation No Disp. Time Titus Time) Disposition Final User 08/23/2024 6:54:17 AM General Information Provided Yes Gokounous, Erin Final Disposition 08/23/2024 6:54:17 AM General Information Provided Yes Gokounous, Rocky

## 2024-08-23 NOTE — Telephone Encounter (Signed)
 Called pt and left a VM advising pt of his appointment time, advised pt for any further questions or concerns to give the office a call back.

## 2024-08-23 NOTE — Patient Instructions (Incomplete)
 It was good to see you today!

## 2024-08-23 NOTE — Progress Notes (Unsigned)
 [facility] [address] [place], [place] [address] [phone]  Date:  08/24/2024   Name:  Craig Jordan.   DOB:  07-01-1971   MRN:  [id number]  PCP:  Watt Harlene BROCKS, MD    Chief Complaint: No chief complaint on file.   History of Present Illness:  Craig Jordan. is a 53 y.o. very pleasant male patient who presents with the following:  Patient seen today for medication follow-up and with concern of chest pain.  I saw him most recently in March History of ADD, glaucoma, obesity, hyperlipidemia, prediabetes   He is treated for ADHD with Adderall 20, 3 tablets daily- this is working well for him, we will continue his current dose  Last adderall RF 2/13 He went to the ER on 9/28 and had some labs and EKG but left prior to further assessment 2 rounds of troponin normal EKG did not indicate STEMI and chest x-ray was normal He did score in 65th percentile on coronary calcium 2 years ago  Discussed the use of AI scribe software for clinical note transcription with the patient, who gave verbal consent to proceed.  History of Present Illness   Flu shot Recommend COVID booster Can get pneumonia vaccine Recommend Shingrix  Patient Active Problem List   Diagnosis Date Noted   Paronychia of finger of right hand 09/25/2022   Prediabetes 04/28/2022   Pure hypercholesterolemia 08/29/2016   Glaucoma 02/05/2015   Snoring 02/11/2013   Chest pain, atypical 12/20/2012   ADD (attention deficit disorder) 12/13/2012   Obesity 12/13/2012    Past Medical History:  Diagnosis Date   ADHD (attention deficit hyperactivity disorder)    Allergy    Glaucoma    Hyperlipidemia    Obesity     Past Surgical History:  Procedure Laterality Date   KNEE SURGERY     arthroscopic -  B meniscus repairs    Social History   Tobacco Use   Smoking status: Former    Current packs/day: 0.00    Types: Cigarettes    Quit  date: 05/17/1994    Years since quitting: 30.2   Smokeless tobacco: Never  Substance Use Topics   Alcohol use: No    Alcohol/week: 0.0 standard drinks of alcohol    Comment: Stopped 2 weeks ago stated on 07/28/2014   Drug use: Yes    Frequency: 7.0 times per week    Types: Marijuana    Family History  Problem Relation Age of Onset   Seizures Father    Epilepsy Father    Arthritis Father        hip OA s/p hip replacement   Sleep apnea Brother     No Known Allergies  Medication list has been reviewed and updated.  Current Outpatient Medications on File Prior to Visit  Medication Sig Dispense Refill   amphetamine -dextroamphetamine  (ADDERALL) 20 MG tablet Take 1 tablet (20 mg total) by mouth 3 (three) times daily. To fill in 60 days 90 tablet 0   amphetamine -dextroamphetamine  (ADDERALL) 20 MG tablet Take 1 tablet (20 mg total) by mouth 3 (three) times daily. To fill in 30 days 90 tablet 0   amphetamine -dextroamphetamine  (ADDERALL) 20 MG tablet Take 1 tablet (20 mg total) by mouth 3 (three) times daily. 90 tablet 0   amphetamine -dextroamphetamine  (ADDERALL) 20 MG tablet Take 1 tablet (20 mg total) by mouth 3 (three) times daily. Need office visit for further  refills 90 tablet 0   amphetamine -dextroamphetamine  (ADDERALL) 30 MG tablet Take 1 tablet by mouth 2 (two) times daily. (Patient not taking: Reported on 02/03/2024) 60 tablet 0   ibuprofen  (ADVIL ) 800 MG tablet Take 1 tablet (800 mg total) by mouth 3 (three) times daily. 21 tablet 0   latanoprost (XALATAN) 0.005 % ophthalmic solution      oxymetazoline (AFRIN) 0.05 % nasal spray Place 2 sprays into the nose at bedtime as needed. For nasal congestion     pravastatin  (PRAVACHOL ) 40 MG tablet Take 1 tablet (40 mg total) by mouth daily. 90 tablet 3   sildenafil  (VIAGRA ) 100 MG tablet TAKE 1/2 TO 1 TABLET(50 TO 100 MG) BY MOUTH DAILY AS NEEDED FOR ERECTILE DYSFUNCTION 5 tablet 11   No current facility-administered medications on file  prior to visit.    Review of Systems:  As per HPI- otherwise negative.   Physical Examination: There were no vitals filed for this visit. There were no vitals filed for this visit. There is no height or weight on file to calculate BMI. Ideal Body Weight:    GEN: no acute distress. HEENT: Atraumatic, Normocephalic.  Ears and Nose: No external deformity. CV: RRR, No M/G/R. No JVD. No thrill. No extra heart sounds. PULM: CTA B, no wheezes, crackles, rhonchi. No retractions. No resp. distress. No accessory muscle use. ABD: S, NT, ND, +BS. No rebound. No HSM. EXTR: No c/c/e PSYCH: Normally interactive. Conversant.    Assessment and Plan: No diagnosis found.  Assessment & Plan   Signed Harlene Schroeder, MD

## 2024-08-24 ENCOUNTER — Ambulatory Visit (INDEPENDENT_AMBULATORY_CARE_PROVIDER_SITE_OTHER): Admitting: Family Medicine

## 2024-08-24 ENCOUNTER — Encounter: Payer: Self-pay | Admitting: Family Medicine

## 2024-08-24 VITALS — BP 145/95 | HR 94 | Ht 71.0 in | Wt 306.8 lb

## 2024-08-24 DIAGNOSIS — E785 Hyperlipidemia, unspecified: Secondary | ICD-10-CM | POA: Diagnosis not present

## 2024-08-24 DIAGNOSIS — R0789 Other chest pain: Secondary | ICD-10-CM

## 2024-08-24 DIAGNOSIS — Z23 Encounter for immunization: Secondary | ICD-10-CM | POA: Diagnosis not present

## 2024-08-24 DIAGNOSIS — I10 Essential (primary) hypertension: Secondary | ICD-10-CM | POA: Diagnosis not present

## 2024-08-24 MED ORDER — LOSARTAN POTASSIUM 25 MG PO TABS: 25.0000 mg | ORAL_TABLET | Freq: Every day | ORAL | 3 refills | Status: AC

## 2024-09-02 ENCOUNTER — Other Ambulatory Visit: Payer: Self-pay | Admitting: Family Medicine

## 2024-09-02 DIAGNOSIS — F988 Other specified behavioral and emotional disorders with onset usually occurring in childhood and adolescence: Secondary | ICD-10-CM

## 2024-09-02 MED ORDER — AMPHETAMINE-DEXTROAMPHETAMINE 20 MG PO TABS: 20.0000 mg | ORAL_TABLET | Freq: Three times a day (TID) | ORAL | 0 refills | Status: AC

## 2024-09-02 NOTE — Telephone Encounter (Signed)
 Copied from CRM [id number]. Topic: Clinical - Medication Refill >> Sep 02, 2024  9:03 AM Mia F wrote: Medication: amphetamine -dextroamphetamine  (ADDERALL) 20 MG tablet   Has the patient contacted their pharmacy? Yes (Agent: If no, request that the patient contact the pharmacy for the refill. If patient does not wish to contact the pharmacy document the reason why and proceed with request.) (Agent: If yes, when and what did the pharmacy advise?)  This is the patient's preferred pharmacy:  [id number] - [place], [place] - [address] [place] [address] Phone: [phone] Fax: [phone]  Is this the correct pharmacy for this prescription? Yes If no, delete pharmacy and type the correct one.   Has the prescription been filled recently? No  Is the patient out of the medication? Yes  Has the patient been seen for an appointment in the last year OR does the patient have an upcoming appointment? Yes  Can we respond through MyChart? Yes  Agent: Please be advised that Rx refills may take up to 3 business days. We ask that you follow-up with your pharmacy.

## 2024-09-02 NOTE — Telephone Encounter (Signed)
 Requesting: Adderall 20 MG Contract: N/A UDS: N/A Last Visit: 08/24/2024 Next Visit: N/A Last Refill: 08/06/2024  Please Advise

## 2024-09-09 DIAGNOSIS — I517 Cardiomegaly: Secondary | ICD-10-CM | POA: Diagnosis not present

## 2024-09-19 DIAGNOSIS — R12 Heartburn: Secondary | ICD-10-CM | POA: Diagnosis not present

## 2024-09-19 DIAGNOSIS — G8929 Other chronic pain: Secondary | ICD-10-CM | POA: Diagnosis not present

## 2024-09-19 DIAGNOSIS — Z681 Body mass index (BMI) 19 or less, adult: Secondary | ICD-10-CM | POA: Diagnosis not present

## 2024-09-19 DIAGNOSIS — M542 Cervicalgia: Secondary | ICD-10-CM | POA: Diagnosis not present

## 2024-09-19 DIAGNOSIS — I4510 Unspecified right bundle-branch block: Secondary | ICD-10-CM | POA: Diagnosis not present

## 2024-09-19 DIAGNOSIS — R030 Elevated blood-pressure reading, without diagnosis of hypertension: Secondary | ICD-10-CM | POA: Diagnosis not present

## 2024-10-05 ENCOUNTER — Other Ambulatory Visit: Payer: Self-pay | Admitting: Family Medicine

## 2024-10-05 DIAGNOSIS — E7800 Pure hypercholesterolemia, unspecified: Secondary | ICD-10-CM

## 2024-10-20 DIAGNOSIS — H5213 Myopia, bilateral: Secondary | ICD-10-CM | POA: Diagnosis not present

## 2024-10-20 DIAGNOSIS — H53143 Visual discomfort, bilateral: Secondary | ICD-10-CM | POA: Diagnosis not present

## 2024-10-20 DIAGNOSIS — D3132 Benign neoplasm of left choroid: Secondary | ICD-10-CM | POA: Diagnosis not present

## 2024-10-20 DIAGNOSIS — H401131 Primary open-angle glaucoma, bilateral, mild stage: Secondary | ICD-10-CM | POA: Diagnosis not present

## 2024-11-01 ENCOUNTER — Telehealth: Payer: Self-pay

## 2024-11-01 NOTE — Telephone Encounter (Signed)
 Copied from CRM #[id number]. Topic: Clinical - Medication Question >> Nov 01, 2024  9:52 AM Mercedes MATSU wrote: Reason for CRM: Patient is switching insurances and the current adderall medication will not be covered. Patient is requesting for an alternative generic options that is accepted by his insurance.   CB: [phone]

## 2024-11-01 NOTE — Telephone Encounter (Signed)
 Mychart message has been sent to pt.

## 2025-04-26 ENCOUNTER — Ambulatory Visit: Admitting: Dermatology
